# Patient Record
Sex: Female | Born: 1965 | Race: Black or African American | Hispanic: No | Marital: Married | Smoking: Never smoker
Health system: Southern US, Community
[De-identification: ages and names within clinical notes are randomized; demographics above are authoritative.]

## PROBLEM LIST (undated history)

## (undated) DIAGNOSIS — K219 Gastro-esophageal reflux disease without esophagitis: Secondary | ICD-10-CM

## (undated) DIAGNOSIS — D649 Anemia, unspecified: Secondary | ICD-10-CM

## (undated) DIAGNOSIS — N2581 Secondary hyperparathyroidism of renal origin: Secondary | ICD-10-CM

## (undated) DIAGNOSIS — N189 Chronic kidney disease, unspecified: Secondary | ICD-10-CM

## (undated) HISTORY — PX: TUBAL LIGATION: SHX77

---

## 1996-11-15 HISTORY — PX: OOPHORECTOMY: SHX86

## 2010-11-15 HISTORY — PX: AV FISTULA PLACEMENT: SHX1204

## 2013-02-14 ENCOUNTER — Other Ambulatory Visit (HOSPITAL_COMMUNITY): Payer: Self-pay | Admitting: Nephrology

## 2013-02-14 DIAGNOSIS — N186 End stage renal disease: Secondary | ICD-10-CM

## 2013-02-14 DIAGNOSIS — Q6119 Other polycystic kidney, infantile type: Secondary | ICD-10-CM

## 2013-02-19 ENCOUNTER — Encounter (HOSPITAL_COMMUNITY): Payer: Self-pay

## 2013-02-19 ENCOUNTER — Other Ambulatory Visit (HOSPITAL_COMMUNITY): Payer: Self-pay | Admitting: Nephrology

## 2013-02-19 ENCOUNTER — Ambulatory Visit (HOSPITAL_COMMUNITY)
Admission: RE | Admit: 2013-02-19 | Discharge: 2013-02-19 | Disposition: A | Discharge status: Home / Self Care | Payer: Managed Care, Other (non HMO) | Source: Ambulatory Visit | Attending: Nephrology | Admitting: Nephrology

## 2013-02-19 DIAGNOSIS — Y832 Surgical operation with anastomosis, bypass or graft as the cause of abnormal reaction of the patient, or of later complication, without mention of misadventure at the time of the procedure: Secondary | ICD-10-CM | POA: Insufficient documentation

## 2013-02-19 DIAGNOSIS — Q613 Polycystic kidney, unspecified: Secondary | ICD-10-CM | POA: Insufficient documentation

## 2013-02-19 DIAGNOSIS — Q6119 Other polycystic kidney, infantile type: Secondary | ICD-10-CM

## 2013-02-19 DIAGNOSIS — Z992 Dependence on renal dialysis: Secondary | ICD-10-CM | POA: Insufficient documentation

## 2013-02-19 DIAGNOSIS — D649 Anemia, unspecified: Secondary | ICD-10-CM | POA: Insufficient documentation

## 2013-02-19 DIAGNOSIS — N186 End stage renal disease: Secondary | ICD-10-CM | POA: Insufficient documentation

## 2013-02-19 DIAGNOSIS — N2581 Secondary hyperparathyroidism of renal origin: Secondary | ICD-10-CM | POA: Insufficient documentation

## 2013-02-19 DIAGNOSIS — I871 Compression of vein: Secondary | ICD-10-CM | POA: Insufficient documentation

## 2013-02-19 DIAGNOSIS — T82898A Other specified complication of vascular prosthetic devices, implants and grafts, initial encounter: Secondary | ICD-10-CM | POA: Insufficient documentation

## 2013-02-19 HISTORY — DX: Secondary hyperparathyroidism of renal origin: N25.81

## 2013-02-19 HISTORY — DX: Chronic kidney disease, unspecified: N18.9

## 2013-02-19 HISTORY — DX: Anemia, unspecified: D64.9

## 2013-02-19 MED ORDER — FENTANYL CITRATE 0.05 MG/ML IJ SOLN
INTRAMUSCULAR | Status: AC | PRN
  Administered 2013-02-19: 50 ug via INTRAVENOUS

## 2013-02-19 MED ORDER — MIDAZOLAM HCL 2 MG/2ML IJ SOLN
INTRAMUSCULAR | Status: AC
  Filled 2013-02-19: qty 2

## 2013-02-19 MED ORDER — IOHEXOL 300 MG/ML  SOLN
100.0000 mL | Freq: Once | INTRAMUSCULAR | Status: AC | PRN
  Administered 2013-02-19: 50 mL via INTRAVENOUS

## 2013-02-19 MED ORDER — FENTANYL CITRATE 0.05 MG/ML IJ SOLN
INTRAMUSCULAR | Status: AC
  Filled 2013-02-19: qty 2

## 2013-02-19 NOTE — Procedures (Signed)
Technically successful fistulogram with angioplasty.  No immediate complications.   

## 2013-02-19 NOTE — ED Notes (Signed)
O2 2l/[place] started 

## 2013-02-19 NOTE — H&P (Signed)
Lisa Oneal is an 47 y.o. female.   Chief Complaint: Dec'd flows at dialysis HPI: 47 y/o F with ESRD on dialysis, now with dec'd flows at dialysis.  Initial fistulagram demonstrates focal moderate narrowing within the main venous outflow amenable to percutaneous intervention.  Otherwise without complaint.  No fever, chills, CP or SOB  Past Medical History  Diagnosis Date  . Chronic kidney disease   . Anemia   . Secondary hyperparathyroidism     Past Surgical History  Procedure Laterality Date  . Av fistula placement Left 2012  . Tubal ligation    . Oophorectomy Left 1998    History reviewed. No pertinent family history. Social History:  reports that she has never smoked. She has never used smokeless tobacco. She reports that she does not use illicit drugs. Her alcohol history is not on file.  Allergies: No Known Allergies   (Not in a hospital admission)  No results found for this or any previous visit (from the past 48 hour(s)). No results found.  Review of Systems  Constitutional: Negative.   Respiratory: Negative.   Cardiovascular: Negative.   Psychiatric/Behavioral: Negative.     Blood pressure 145/75, pulse 92, resp. rate 19, last menstrual period 02/12/2013, SpO2 99.00%. Physical Exam  Constitutional: She appears well-developed and well-nourished.  Cardiovascular: Normal heart sounds.   Respiratory: Effort normal and breath sounds normal.  Psychiatric: She has a normal mood and affect. Her behavior is normal.     Assessment/Plan 47 y/o F with ESRD on dialysis, now with dec'd flows at dialysis.  Initial fistulagram demonstrates focal moderate narrowing within the main venous outflow amenable to percutaneous intervention.  Informed written consent obtained after a discussion of the benefits and risks (including but not limited to bleeding and vessel injury) for fistulagram with intervention.  Sandi Mariscal 02/19/2013, 2:13 PM

## 2013-02-19 NOTE — ED Notes (Signed)
Fentanyl given by Dr Pascal Lux through access.

## 2013-02-19 NOTE — ED Notes (Signed)
Tolerated procedure well.  Feels no sedation effects from fentanyl.  Ambulatory ready for D/C with family.  To dialysis tomorrow.  Encouraged to call or return for any problems with bleeding or pain.

## 2014-02-11 ENCOUNTER — Encounter (HOSPITAL_COMMUNITY): Payer: Self-pay

## 2014-02-14 ENCOUNTER — Other Ambulatory Visit: Payer: Self-pay

## 2014-02-18 ENCOUNTER — Other Ambulatory Visit: Payer: Self-pay | Admitting: *Deleted

## 2014-02-19 ENCOUNTER — Ambulatory Visit (HOSPITAL_COMMUNITY)
Admission: RE | Admit: 2014-02-19 | Discharge: 2014-02-19 | Disposition: A | Discharge status: Home / Self Care | Payer: Managed Care, Other (non HMO) | Source: Ambulatory Visit | Attending: Surgery | Admitting: Surgery

## 2014-02-19 ENCOUNTER — Encounter (HOSPITAL_COMMUNITY)
Admission: RE | Disposition: A | Discharge status: Home / Self Care | Payer: Self-pay | Source: Ambulatory Visit | Attending: Surgery

## 2014-02-19 DIAGNOSIS — T82898A Other specified complication of vascular prosthetic devices, implants and grafts, initial encounter: Secondary | ICD-10-CM

## 2014-02-19 DIAGNOSIS — N189 Chronic kidney disease, unspecified: Secondary | ICD-10-CM | POA: Insufficient documentation

## 2014-02-19 DIAGNOSIS — N2581 Secondary hyperparathyroidism of renal origin: Secondary | ICD-10-CM | POA: Insufficient documentation

## 2014-02-19 DIAGNOSIS — Y838 Other surgical procedures as the cause of abnormal reaction of the patient, or of later complication, without mention of misadventure at the time of the procedure: Secondary | ICD-10-CM | POA: Insufficient documentation

## 2014-02-19 HISTORY — PX: SHUNTOGRAM: SHX5491

## 2014-02-19 LAB — POCT I-STAT, CHEM 8
BUN: 34 mg/dL — ABNORMAL HIGH (ref 6–23)
Calcium, Ion: 1.07 mmol/L — ABNORMAL LOW (ref 1.12–1.23)
Chloride: 98 mEq/L (ref 96–112)
Creatinine, Ser: 12 mg/dL — ABNORMAL HIGH (ref 0.50–1.10)
Glucose, Bld: 97 mg/dL (ref 70–99)
HEMATOCRIT: 36 % (ref 36.0–46.0)
HEMOGLOBIN: 12.2 g/dL (ref 12.0–15.0)
POTASSIUM: 4.6 meq/L (ref 3.7–5.3)
SODIUM: 137 meq/L (ref 137–147)
TCO2: 28 mmol/L (ref 0–100)

## 2014-02-19 SURGERY — ASSESSMENT, SHUNT FUNCTION, WITH CONTRAST RADIOGRAPHIC STUDY
Anesthesia: LOCAL

## 2014-02-19 MED ORDER — ACETAMINOPHEN 325 MG PO TABS
325.0000 mg | ORAL_TABLET | ORAL | Status: DC | PRN
Start: 2014-02-19 — End: 2014-02-19
  Filled 2014-02-19: qty 2

## 2014-02-19 MED ORDER — SODIUM CHLORIDE 0.9 % IJ SOLN: 3.0000 mL | INTRAMUSCULAR | Status: DC | PRN

## 2014-02-19 MED ORDER — HEPARIN (PORCINE) IN NACL 2-0.9 UNIT/ML-% IJ SOLN
INTRAMUSCULAR | Status: AC
  Filled 2014-02-19: qty 500

## 2014-02-19 MED ORDER — LIDOCAINE HCL (PF) 1 % IJ SOLN
INTRAMUSCULAR | Status: AC
  Filled 2014-02-19: qty 30

## 2014-02-19 MED ORDER — ONDANSETRON HCL 4 MG/2ML IJ SOLN: 4.0000 mg | Freq: Four times a day (QID) | INTRAMUSCULAR | Status: DC | PRN

## 2014-02-19 MED ORDER — ACETAMINOPHEN 325 MG RE SUPP
325.0000 mg | RECTAL | Status: DC | PRN
  Filled 2014-02-19: qty 2

## 2014-02-19 NOTE — H&P (Signed)
      Patient name: Lisa Oneal MRN: [id number] DOB: [birth year] Sex: female    No chief complaint on file.   HISTORY OF PRESENT ILLNESS: The patient is here for evaluation of her left upper arm AV fistula which was placed in 2012 at [facility].  She has been having decreased flow rates.  Past Medical History  Diagnosis Date  . Chronic kidney disease   . Anemia   . Secondary hyperparathyroidism     Past Surgical History  Procedure Laterality Date  . Av fistula placement Left 2012  . Tubal ligation    . Oophorectomy Left 1998    History   Social History  . Marital Status: Married    Spouse Name: N/A    Number of Children: N/A  . Years of Education: N/A   Occupational History  . Not on file.   Social History Main Topics  . Smoking status: Never Smoker   . Smokeless tobacco: Never Used  . Alcohol Use: Not on file  . Drug Use: No  . Sexual Activity: Not on file   Other Topics Concern  . Not on file   Social History Narrative  . No narrative on file    No family history on file.  Allergies as of 02/08/2014  . (No Known Allergies)    No current facility-administered medications on file prior to encounter.   Current Outpatient Prescriptions on File Prior to Encounter  Medication Sig Dispense Refill  . sevelamer carbonate (RENVELA) 800 MG tablet Take 3,200 mg by mouth 3 (three) times daily with meals. Take 4 tablets 3 times a day with meals & 2 tablets with snacks      . zolpidem (AMBIEN) 10 MG tablet Take 10 mg by mouth at bedtime as needed for sleep.          REVIEW OF SYSTEMS: Cardiovascular: No chest pain, chest pressure, palpitations, orthopnea, or dyspnea on exertion. No claudication or rest pain,  No history of DVT or phlebitis. Pulmonary: No productive cough, asthma or wheezing. Neurologic: No weakness, paresthesias, aphasia, or amaurosis. No dizziness. Hematologic: No bleeding problems or clotting disorders. Musculoskeletal: No joint pain  or joint swelling. Gastrointestinal: No blood in stool or hematemesis Genitourinary: No dysuria or hematuria. Psychiatric:: No history of major depression. Integumentary: No rashes or ulcers. Constitutional: No fever or chills.  PHYSICAL EXAMINATION:   Vital signs are BP 124/76  Pulse 98  Temp(Src) 98.3 F (36.8 C) (Oral)  Resp 18  Ht 5\' 3"  (1.6 m)  Wt 190 lb (86.183 kg)  BMI 33.67 kg/m2  SpO2 98% General: The patient appears their stated age. HEENT:  No gross abnormalities Pulmonary:  Non labored breathing Musculoskeletal: There are no major deformities. Neurologic: No focal weakness or paresthesias are detected, Skin: There are no ulcer or rashes noted. Psychiatric: The patient has normal affect. Cardiovascular: Palpable thrill within the left upper arm fistula which become diminished in the upper arm  Diagnostic Studies None  Assessment: End stage renal disease Plan: Left upper arm fistulogram with possible intervention.  The details of the procedure were discussed with the patient.  Eldridge Abrahams, M.D. [facility] [place] Office: [phone] Pager:  [phone]

## 2014-02-19 NOTE — Discharge Instructions (Signed)
AV Fistula °Care After °Refer to this sheet in the next few weeks. These instructions provide you with information on caring for yourself after your procedure. Your caregiver may also give you more specific instructions. Your treatment has been planned according to current medical practices, but problems sometimes occur. Call your caregiver if you have any problems or questions after your procedure. °HOME CARE INSTRUCTIONS  °· Do not drive a car or take public transportation alone. °· Do not drink alcohol. °· Only take medicine that has been prescribed by your caregiver. °· Do not sign important papers or make important decisions. °· Have a responsible person with you. °· Ask your caregiver to show you how to check your access at home for a vibration (called a "thrill") or for a sound (called a "bruit" pronounced brew-ee). °· Your vein will need time to enlarge and mature so needles can be inserted for dialysis. Follow your caregiver's instructions about what you need to do to make this happen. °· Keep dressings clean and dry. °· Keep the arm elevated above your heart. Use a pillow. °· Rest. °· Use the arm as usual for all activities. °· Have the stitches or tape closures removed in 10 to 14 days, or as directed by your caregiver. °· Do not sleep or lie on the area of the fistula or that arm. This may decrease or stop the blood flow through your fistula. °· Do not allow blood pressures to be taken on this arm. °· Do not allow blood drawing to be done from the graft. °· Do not wear tight clothing around the access site or on the arm. °· Avoid lifting heavy objects with the arm that has the fistula. °· Do not use creams or lotions over the access site. °SEEK MEDICAL CARE IF:  °· You have a fever. °· You have swelling around the fistula that gets worse, or you have new pain. °· You have unusual bleeding at the fistula site or from any other area. °· You have pus or other drainage at the fistula site. °· You have skin  redness or red streaking on the skin around, above, or below the fistula site. °· Your access site feels warm. °· You have any flu-like symptoms. °SEEK IMMEDIATE MEDICAL CARE IF:  °· You have pain, numbness, or an unusual pale skin on the hand or on the side of your fistula. °· You have dizziness or weakness that you have not had before. °· You have shortness of breath. °· You have chest pain. °· Your fistula disconnects or breaks, and there is bleeding that cannot be easily controlled. °Call for local emergency medical help. Do not try to drive yourself to the hospital. °MAKE SURE YOU °· Understand these instructions. °· Will watch your condition. °· Will get help right away if you are not doing well or get worse. °Document Released: 11/01/2005 Document Revised: 01/24/2012 Document Reviewed: 04/21/2011 °ExitCare® Patient Information ©2014 [employer]. ° °

## 2014-02-19 NOTE — Op Note (Signed)
    Patient name: Lisa Oneal MRN: [id number] DOB: 1966-07-30 Sex: female  02/19/2014 Pre-operative Diagnosis: End stage renal disease Post-operative diagnosis:  Same Surgeon:  Eldridge Abrahams Procedure Performed:  1.  ultrasound-guided access, left upper arm fistula  2.  fistulogram  3.  angioplasty, left cephalic vein  4.  followup x1   Indications:  The patient has had a left upper arm fistula placed at an outside institution.  She has had decreased flow rates and was sent for further evaluation  Procedure:  The patient was identified in the holding area and taken to room 8.  The patient was then placed supine on the table and prepped and draped in the usual sterile fashion.  A time out was called.  Ultrasound was used to evaluate the fistula.  The vein was patent and compressible.  A digital ultrasound image was acquired.  The fistula was then accessed under ultrasound guidance using a micropuncture needle.  An 018 wire was then asvanced without resistance and a micropuncture sheath was placed.  Contrast injections were then performed through the sheath.  Findings:  The arterial venous anastomosis is widely patent.  The cephalic vein fistula is patent throughout it's course however at the humeral head there is a significant narrowing approximately 80%.  The remainder of the central venous system is widely patent.   Intervention:  After the above images were acquired, the decision was made to proceed with intervention.  Over a 035 wire, a 6 French sheath was placed.  I then selected a 5 x 40 Mustang balloon.  This was used to perform balloon angioplasty of the cephalic vein stenosis.  It was taken to burst pressure.  Followup imaging revealed improved, but suboptimal result.  I therefore selected a 7 x 40 balloon.  At approximately 18 atmospheres, it reached profile.  Followup imaging revealed a small tear to the vein.  I reinserted the balloon, to get to profile at about 8 atmospheres  and held for 2 minutes.  A followup imaging revealed persistence of the tear however it was not flow limiting and there was no extravasation.  I elected to leave this.  The sheath was removed over a suture.  There were no complications  Impression:  #1  successful angioplasty of a [id number] left cephalic vein stenosis at the level of the humeral head using a 7 x 40 balloon  #2  the patient's the fistula remained amenable to percutaneous intervention   V. Annamarie Major, M.D. [facility] [place] Office: [phone] Pager:  [phone]

## 2014-02-20 ENCOUNTER — Ambulatory Visit: Admit: 2014-02-20 | Payer: Self-pay | Admitting: Surgery

## 2014-02-20 SURGERY — THROMBECTOMY AND REVISION OF ARTERIOVENTOUS (AV) GORETEX  GRAFT
Anesthesia: Monitor Anesthesia Care | Laterality: Right

## 2014-10-24 ENCOUNTER — Encounter (HOSPITAL_COMMUNITY): Payer: Self-pay | Admitting: Surgery

## 2015-11-17 DIAGNOSIS — Z992 Dependence on renal dialysis: Secondary | ICD-10-CM | POA: Diagnosis not present

## 2015-11-17 DIAGNOSIS — N2581 Secondary hyperparathyroidism of renal origin: Secondary | ICD-10-CM | POA: Diagnosis not present

## 2015-11-17 DIAGNOSIS — E611 Iron deficiency: Secondary | ICD-10-CM | POA: Diagnosis not present

## 2015-11-17 DIAGNOSIS — N186 End stage renal disease: Secondary | ICD-10-CM | POA: Diagnosis not present

## 2015-11-17 DIAGNOSIS — D631 Anemia in chronic kidney disease: Secondary | ICD-10-CM | POA: Diagnosis not present

## 2015-11-19 DIAGNOSIS — E611 Iron deficiency: Secondary | ICD-10-CM | POA: Diagnosis not present

## 2015-11-19 DIAGNOSIS — D631 Anemia in chronic kidney disease: Secondary | ICD-10-CM | POA: Diagnosis not present

## 2015-11-19 DIAGNOSIS — N2581 Secondary hyperparathyroidism of renal origin: Secondary | ICD-10-CM | POA: Diagnosis not present

## 2015-11-19 DIAGNOSIS — Z992 Dependence on renal dialysis: Secondary | ICD-10-CM | POA: Diagnosis not present

## 2015-11-19 DIAGNOSIS — N186 End stage renal disease: Secondary | ICD-10-CM | POA: Diagnosis not present

## 2015-11-21 DIAGNOSIS — D631 Anemia in chronic kidney disease: Secondary | ICD-10-CM | POA: Diagnosis not present

## 2015-11-21 DIAGNOSIS — Z992 Dependence on renal dialysis: Secondary | ICD-10-CM | POA: Diagnosis not present

## 2015-11-21 DIAGNOSIS — N186 End stage renal disease: Secondary | ICD-10-CM | POA: Diagnosis not present

## 2015-11-21 DIAGNOSIS — N2581 Secondary hyperparathyroidism of renal origin: Secondary | ICD-10-CM | POA: Diagnosis not present

## 2015-11-21 DIAGNOSIS — E611 Iron deficiency: Secondary | ICD-10-CM | POA: Diagnosis not present

## 2015-11-24 DIAGNOSIS — Z992 Dependence on renal dialysis: Secondary | ICD-10-CM | POA: Diagnosis not present

## 2015-11-24 DIAGNOSIS — E611 Iron deficiency: Secondary | ICD-10-CM | POA: Diagnosis not present

## 2015-11-24 DIAGNOSIS — N2581 Secondary hyperparathyroidism of renal origin: Secondary | ICD-10-CM | POA: Diagnosis not present

## 2015-11-24 DIAGNOSIS — D631 Anemia in chronic kidney disease: Secondary | ICD-10-CM | POA: Diagnosis not present

## 2015-11-24 DIAGNOSIS — N186 End stage renal disease: Secondary | ICD-10-CM | POA: Diagnosis not present

## 2015-11-26 DIAGNOSIS — E611 Iron deficiency: Secondary | ICD-10-CM | POA: Diagnosis not present

## 2015-11-26 DIAGNOSIS — Z992 Dependence on renal dialysis: Secondary | ICD-10-CM | POA: Diagnosis not present

## 2015-11-26 DIAGNOSIS — N186 End stage renal disease: Secondary | ICD-10-CM | POA: Diagnosis not present

## 2015-11-26 DIAGNOSIS — D631 Anemia in chronic kidney disease: Secondary | ICD-10-CM | POA: Diagnosis not present

## 2015-11-26 DIAGNOSIS — N2581 Secondary hyperparathyroidism of renal origin: Secondary | ICD-10-CM | POA: Diagnosis not present

## 2015-11-28 DIAGNOSIS — D631 Anemia in chronic kidney disease: Secondary | ICD-10-CM | POA: Diagnosis not present

## 2015-11-28 DIAGNOSIS — N2581 Secondary hyperparathyroidism of renal origin: Secondary | ICD-10-CM | POA: Diagnosis not present

## 2015-11-28 DIAGNOSIS — E611 Iron deficiency: Secondary | ICD-10-CM | POA: Diagnosis not present

## 2015-11-28 DIAGNOSIS — Z992 Dependence on renal dialysis: Secondary | ICD-10-CM | POA: Diagnosis not present

## 2015-11-28 DIAGNOSIS — N186 End stage renal disease: Secondary | ICD-10-CM | POA: Diagnosis not present

## 2015-12-01 DIAGNOSIS — D631 Anemia in chronic kidney disease: Secondary | ICD-10-CM | POA: Diagnosis not present

## 2015-12-01 DIAGNOSIS — N2581 Secondary hyperparathyroidism of renal origin: Secondary | ICD-10-CM | POA: Diagnosis not present

## 2015-12-01 DIAGNOSIS — Z992 Dependence on renal dialysis: Secondary | ICD-10-CM | POA: Diagnosis not present

## 2015-12-01 DIAGNOSIS — N186 End stage renal disease: Secondary | ICD-10-CM | POA: Diagnosis not present

## 2015-12-01 DIAGNOSIS — E611 Iron deficiency: Secondary | ICD-10-CM | POA: Diagnosis not present

## 2015-12-02 DIAGNOSIS — Z992 Dependence on renal dialysis: Secondary | ICD-10-CM | POA: Diagnosis not present

## 2015-12-02 DIAGNOSIS — M7730 Calcaneal spur, unspecified foot: Secondary | ICD-10-CM | POA: Diagnosis not present

## 2015-12-02 DIAGNOSIS — M79673 Pain in unspecified foot: Secondary | ICD-10-CM | POA: Diagnosis not present

## 2015-12-02 DIAGNOSIS — M7661 Achilles tendinitis, right leg: Secondary | ICD-10-CM | POA: Diagnosis not present

## 2015-12-02 DIAGNOSIS — M2010 Hallux valgus (acquired), unspecified foot: Secondary | ICD-10-CM | POA: Diagnosis not present

## 2015-12-03 DIAGNOSIS — Z992 Dependence on renal dialysis: Secondary | ICD-10-CM | POA: Diagnosis not present

## 2015-12-03 DIAGNOSIS — N186 End stage renal disease: Secondary | ICD-10-CM | POA: Diagnosis not present

## 2015-12-03 DIAGNOSIS — E611 Iron deficiency: Secondary | ICD-10-CM | POA: Diagnosis not present

## 2015-12-03 DIAGNOSIS — D631 Anemia in chronic kidney disease: Secondary | ICD-10-CM | POA: Diagnosis not present

## 2015-12-03 DIAGNOSIS — N2581 Secondary hyperparathyroidism of renal origin: Secondary | ICD-10-CM | POA: Diagnosis not present

## 2015-12-05 DIAGNOSIS — N186 End stage renal disease: Secondary | ICD-10-CM | POA: Diagnosis not present

## 2015-12-05 DIAGNOSIS — E611 Iron deficiency: Secondary | ICD-10-CM | POA: Diagnosis not present

## 2015-12-05 DIAGNOSIS — Z992 Dependence on renal dialysis: Secondary | ICD-10-CM | POA: Diagnosis not present

## 2015-12-05 DIAGNOSIS — D631 Anemia in chronic kidney disease: Secondary | ICD-10-CM | POA: Diagnosis not present

## 2015-12-05 DIAGNOSIS — N2581 Secondary hyperparathyroidism of renal origin: Secondary | ICD-10-CM | POA: Diagnosis not present

## 2015-12-08 DIAGNOSIS — E611 Iron deficiency: Secondary | ICD-10-CM | POA: Diagnosis not present

## 2015-12-08 DIAGNOSIS — N2581 Secondary hyperparathyroidism of renal origin: Secondary | ICD-10-CM | POA: Diagnosis not present

## 2015-12-08 DIAGNOSIS — D631 Anemia in chronic kidney disease: Secondary | ICD-10-CM | POA: Diagnosis not present

## 2015-12-08 DIAGNOSIS — N186 End stage renal disease: Secondary | ICD-10-CM | POA: Diagnosis not present

## 2015-12-08 DIAGNOSIS — Z992 Dependence on renal dialysis: Secondary | ICD-10-CM | POA: Diagnosis not present

## 2015-12-10 DIAGNOSIS — D631 Anemia in chronic kidney disease: Secondary | ICD-10-CM | POA: Diagnosis not present

## 2015-12-10 DIAGNOSIS — N2581 Secondary hyperparathyroidism of renal origin: Secondary | ICD-10-CM | POA: Diagnosis not present

## 2015-12-10 DIAGNOSIS — Z992 Dependence on renal dialysis: Secondary | ICD-10-CM | POA: Diagnosis not present

## 2015-12-10 DIAGNOSIS — N186 End stage renal disease: Secondary | ICD-10-CM | POA: Diagnosis not present

## 2015-12-10 DIAGNOSIS — E611 Iron deficiency: Secondary | ICD-10-CM | POA: Diagnosis not present

## 2015-12-12 DIAGNOSIS — E611 Iron deficiency: Secondary | ICD-10-CM | POA: Diagnosis not present

## 2015-12-12 DIAGNOSIS — N2581 Secondary hyperparathyroidism of renal origin: Secondary | ICD-10-CM | POA: Diagnosis not present

## 2015-12-12 DIAGNOSIS — Z992 Dependence on renal dialysis: Secondary | ICD-10-CM | POA: Diagnosis not present

## 2015-12-12 DIAGNOSIS — N186 End stage renal disease: Secondary | ICD-10-CM | POA: Diagnosis not present

## 2015-12-12 DIAGNOSIS — D631 Anemia in chronic kidney disease: Secondary | ICD-10-CM | POA: Diagnosis not present

## 2015-12-15 DIAGNOSIS — Z992 Dependence on renal dialysis: Secondary | ICD-10-CM | POA: Diagnosis not present

## 2015-12-15 DIAGNOSIS — N186 End stage renal disease: Secondary | ICD-10-CM | POA: Diagnosis not present

## 2015-12-15 DIAGNOSIS — E611 Iron deficiency: Secondary | ICD-10-CM | POA: Diagnosis not present

## 2015-12-15 DIAGNOSIS — N2581 Secondary hyperparathyroidism of renal origin: Secondary | ICD-10-CM | POA: Diagnosis not present

## 2015-12-15 DIAGNOSIS — D631 Anemia in chronic kidney disease: Secondary | ICD-10-CM | POA: Diagnosis not present

## 2015-12-16 DIAGNOSIS — Z992 Dependence on renal dialysis: Secondary | ICD-10-CM | POA: Diagnosis not present

## 2015-12-16 DIAGNOSIS — N186 End stage renal disease: Secondary | ICD-10-CM | POA: Diagnosis not present

## 2015-12-17 DIAGNOSIS — E611 Iron deficiency: Secondary | ICD-10-CM | POA: Diagnosis not present

## 2015-12-17 DIAGNOSIS — N2581 Secondary hyperparathyroidism of renal origin: Secondary | ICD-10-CM | POA: Diagnosis not present

## 2015-12-17 DIAGNOSIS — N186 End stage renal disease: Secondary | ICD-10-CM | POA: Diagnosis not present

## 2015-12-17 DIAGNOSIS — Z992 Dependence on renal dialysis: Secondary | ICD-10-CM | POA: Diagnosis not present

## 2015-12-17 DIAGNOSIS — D631 Anemia in chronic kidney disease: Secondary | ICD-10-CM | POA: Diagnosis not present

## 2015-12-19 DIAGNOSIS — N186 End stage renal disease: Secondary | ICD-10-CM | POA: Diagnosis not present

## 2015-12-19 DIAGNOSIS — N2581 Secondary hyperparathyroidism of renal origin: Secondary | ICD-10-CM | POA: Diagnosis not present

## 2015-12-19 DIAGNOSIS — Z992 Dependence on renal dialysis: Secondary | ICD-10-CM | POA: Diagnosis not present

## 2015-12-19 DIAGNOSIS — D631 Anemia in chronic kidney disease: Secondary | ICD-10-CM | POA: Diagnosis not present

## 2015-12-19 DIAGNOSIS — E611 Iron deficiency: Secondary | ICD-10-CM | POA: Diagnosis not present

## 2015-12-22 DIAGNOSIS — Z992 Dependence on renal dialysis: Secondary | ICD-10-CM | POA: Diagnosis not present

## 2015-12-22 DIAGNOSIS — N2581 Secondary hyperparathyroidism of renal origin: Secondary | ICD-10-CM | POA: Diagnosis not present

## 2015-12-22 DIAGNOSIS — E611 Iron deficiency: Secondary | ICD-10-CM | POA: Diagnosis not present

## 2015-12-22 DIAGNOSIS — N186 End stage renal disease: Secondary | ICD-10-CM | POA: Diagnosis not present

## 2015-12-22 DIAGNOSIS — D631 Anemia in chronic kidney disease: Secondary | ICD-10-CM | POA: Diagnosis not present

## 2015-12-24 DIAGNOSIS — D631 Anemia in chronic kidney disease: Secondary | ICD-10-CM | POA: Diagnosis not present

## 2015-12-24 DIAGNOSIS — Z992 Dependence on renal dialysis: Secondary | ICD-10-CM | POA: Diagnosis not present

## 2015-12-24 DIAGNOSIS — N186 End stage renal disease: Secondary | ICD-10-CM | POA: Diagnosis not present

## 2015-12-24 DIAGNOSIS — E611 Iron deficiency: Secondary | ICD-10-CM | POA: Diagnosis not present

## 2015-12-24 DIAGNOSIS — N2581 Secondary hyperparathyroidism of renal origin: Secondary | ICD-10-CM | POA: Diagnosis not present

## 2015-12-26 DIAGNOSIS — D631 Anemia in chronic kidney disease: Secondary | ICD-10-CM | POA: Diagnosis not present

## 2015-12-26 DIAGNOSIS — Z992 Dependence on renal dialysis: Secondary | ICD-10-CM | POA: Diagnosis not present

## 2015-12-26 DIAGNOSIS — N186 End stage renal disease: Secondary | ICD-10-CM | POA: Diagnosis not present

## 2015-12-26 DIAGNOSIS — E611 Iron deficiency: Secondary | ICD-10-CM | POA: Diagnosis not present

## 2015-12-26 DIAGNOSIS — N2581 Secondary hyperparathyroidism of renal origin: Secondary | ICD-10-CM | POA: Diagnosis not present

## 2015-12-29 DIAGNOSIS — N2581 Secondary hyperparathyroidism of renal origin: Secondary | ICD-10-CM | POA: Diagnosis not present

## 2015-12-29 DIAGNOSIS — Z992 Dependence on renal dialysis: Secondary | ICD-10-CM | POA: Diagnosis not present

## 2015-12-29 DIAGNOSIS — N186 End stage renal disease: Secondary | ICD-10-CM | POA: Diagnosis not present

## 2015-12-29 DIAGNOSIS — D631 Anemia in chronic kidney disease: Secondary | ICD-10-CM | POA: Diagnosis not present

## 2015-12-29 DIAGNOSIS — E611 Iron deficiency: Secondary | ICD-10-CM | POA: Diagnosis not present

## 2015-12-31 DIAGNOSIS — E611 Iron deficiency: Secondary | ICD-10-CM | POA: Diagnosis not present

## 2015-12-31 DIAGNOSIS — N2581 Secondary hyperparathyroidism of renal origin: Secondary | ICD-10-CM | POA: Diagnosis not present

## 2015-12-31 DIAGNOSIS — D631 Anemia in chronic kidney disease: Secondary | ICD-10-CM | POA: Diagnosis not present

## 2015-12-31 DIAGNOSIS — N186 End stage renal disease: Secondary | ICD-10-CM | POA: Diagnosis not present

## 2015-12-31 DIAGNOSIS — Z992 Dependence on renal dialysis: Secondary | ICD-10-CM | POA: Diagnosis not present

## 2016-01-02 DIAGNOSIS — E611 Iron deficiency: Secondary | ICD-10-CM | POA: Diagnosis not present

## 2016-01-02 DIAGNOSIS — Z992 Dependence on renal dialysis: Secondary | ICD-10-CM | POA: Diagnosis not present

## 2016-01-02 DIAGNOSIS — N186 End stage renal disease: Secondary | ICD-10-CM | POA: Diagnosis not present

## 2016-01-02 DIAGNOSIS — N2581 Secondary hyperparathyroidism of renal origin: Secondary | ICD-10-CM | POA: Diagnosis not present

## 2016-01-02 DIAGNOSIS — D631 Anemia in chronic kidney disease: Secondary | ICD-10-CM | POA: Diagnosis not present

## 2016-01-05 DIAGNOSIS — E611 Iron deficiency: Secondary | ICD-10-CM | POA: Diagnosis not present

## 2016-01-05 DIAGNOSIS — N186 End stage renal disease: Secondary | ICD-10-CM | POA: Diagnosis not present

## 2016-01-05 DIAGNOSIS — Z992 Dependence on renal dialysis: Secondary | ICD-10-CM | POA: Diagnosis not present

## 2016-01-05 DIAGNOSIS — D631 Anemia in chronic kidney disease: Secondary | ICD-10-CM | POA: Diagnosis not present

## 2016-01-05 DIAGNOSIS — N2581 Secondary hyperparathyroidism of renal origin: Secondary | ICD-10-CM | POA: Diagnosis not present

## 2016-01-07 DIAGNOSIS — D631 Anemia in chronic kidney disease: Secondary | ICD-10-CM | POA: Diagnosis not present

## 2016-01-07 DIAGNOSIS — E611 Iron deficiency: Secondary | ICD-10-CM | POA: Diagnosis not present

## 2016-01-07 DIAGNOSIS — N2581 Secondary hyperparathyroidism of renal origin: Secondary | ICD-10-CM | POA: Diagnosis not present

## 2016-01-07 DIAGNOSIS — Z992 Dependence on renal dialysis: Secondary | ICD-10-CM | POA: Diagnosis not present

## 2016-01-07 DIAGNOSIS — N186 End stage renal disease: Secondary | ICD-10-CM | POA: Diagnosis not present

## 2016-01-09 DIAGNOSIS — D631 Anemia in chronic kidney disease: Secondary | ICD-10-CM | POA: Diagnosis not present

## 2016-01-09 DIAGNOSIS — Z992 Dependence on renal dialysis: Secondary | ICD-10-CM | POA: Diagnosis not present

## 2016-01-09 DIAGNOSIS — N186 End stage renal disease: Secondary | ICD-10-CM | POA: Diagnosis not present

## 2016-01-09 DIAGNOSIS — E611 Iron deficiency: Secondary | ICD-10-CM | POA: Diagnosis not present

## 2016-01-09 DIAGNOSIS — N2581 Secondary hyperparathyroidism of renal origin: Secondary | ICD-10-CM | POA: Diagnosis not present

## 2016-01-12 DIAGNOSIS — Z992 Dependence on renal dialysis: Secondary | ICD-10-CM | POA: Diagnosis not present

## 2016-01-12 DIAGNOSIS — E611 Iron deficiency: Secondary | ICD-10-CM | POA: Diagnosis not present

## 2016-01-12 DIAGNOSIS — N186 End stage renal disease: Secondary | ICD-10-CM | POA: Diagnosis not present

## 2016-01-12 DIAGNOSIS — D631 Anemia in chronic kidney disease: Secondary | ICD-10-CM | POA: Diagnosis not present

## 2016-01-12 DIAGNOSIS — N2581 Secondary hyperparathyroidism of renal origin: Secondary | ICD-10-CM | POA: Diagnosis not present

## 2016-01-13 DIAGNOSIS — N186 End stage renal disease: Secondary | ICD-10-CM | POA: Diagnosis not present

## 2016-01-13 DIAGNOSIS — Z992 Dependence on renal dialysis: Secondary | ICD-10-CM | POA: Diagnosis not present

## 2016-01-14 DIAGNOSIS — D631 Anemia in chronic kidney disease: Secondary | ICD-10-CM | POA: Diagnosis not present

## 2016-01-14 DIAGNOSIS — N186 End stage renal disease: Secondary | ICD-10-CM | POA: Diagnosis not present

## 2016-01-14 DIAGNOSIS — D509 Iron deficiency anemia, unspecified: Secondary | ICD-10-CM | POA: Diagnosis not present

## 2016-01-14 DIAGNOSIS — N2581 Secondary hyperparathyroidism of renal origin: Secondary | ICD-10-CM | POA: Diagnosis not present

## 2016-01-14 DIAGNOSIS — Z992 Dependence on renal dialysis: Secondary | ICD-10-CM | POA: Diagnosis not present

## 2016-01-14 DIAGNOSIS — E611 Iron deficiency: Secondary | ICD-10-CM | POA: Diagnosis not present

## 2016-01-16 DIAGNOSIS — D631 Anemia in chronic kidney disease: Secondary | ICD-10-CM | POA: Diagnosis not present

## 2016-01-16 DIAGNOSIS — Z992 Dependence on renal dialysis: Secondary | ICD-10-CM | POA: Diagnosis not present

## 2016-01-16 DIAGNOSIS — D509 Iron deficiency anemia, unspecified: Secondary | ICD-10-CM | POA: Diagnosis not present

## 2016-01-16 DIAGNOSIS — N2581 Secondary hyperparathyroidism of renal origin: Secondary | ICD-10-CM | POA: Diagnosis not present

## 2016-01-16 DIAGNOSIS — N186 End stage renal disease: Secondary | ICD-10-CM | POA: Diagnosis not present

## 2016-01-16 DIAGNOSIS — E611 Iron deficiency: Secondary | ICD-10-CM | POA: Diagnosis not present

## 2016-01-19 DIAGNOSIS — N186 End stage renal disease: Secondary | ICD-10-CM | POA: Diagnosis not present

## 2016-01-19 DIAGNOSIS — N2581 Secondary hyperparathyroidism of renal origin: Secondary | ICD-10-CM | POA: Diagnosis not present

## 2016-01-19 DIAGNOSIS — E611 Iron deficiency: Secondary | ICD-10-CM | POA: Diagnosis not present

## 2016-01-19 DIAGNOSIS — Z992 Dependence on renal dialysis: Secondary | ICD-10-CM | POA: Diagnosis not present

## 2016-01-19 DIAGNOSIS — D631 Anemia in chronic kidney disease: Secondary | ICD-10-CM | POA: Diagnosis not present

## 2016-01-19 DIAGNOSIS — D509 Iron deficiency anemia, unspecified: Secondary | ICD-10-CM | POA: Diagnosis not present

## 2016-01-21 DIAGNOSIS — D509 Iron deficiency anemia, unspecified: Secondary | ICD-10-CM | POA: Diagnosis not present

## 2016-01-21 DIAGNOSIS — N2581 Secondary hyperparathyroidism of renal origin: Secondary | ICD-10-CM | POA: Diagnosis not present

## 2016-01-21 DIAGNOSIS — N186 End stage renal disease: Secondary | ICD-10-CM | POA: Diagnosis not present

## 2016-01-21 DIAGNOSIS — E611 Iron deficiency: Secondary | ICD-10-CM | POA: Diagnosis not present

## 2016-01-21 DIAGNOSIS — D631 Anemia in chronic kidney disease: Secondary | ICD-10-CM | POA: Diagnosis not present

## 2016-01-21 DIAGNOSIS — Z992 Dependence on renal dialysis: Secondary | ICD-10-CM | POA: Diagnosis not present

## 2016-01-23 DIAGNOSIS — N2581 Secondary hyperparathyroidism of renal origin: Secondary | ICD-10-CM | POA: Diagnosis not present

## 2016-01-23 DIAGNOSIS — D631 Anemia in chronic kidney disease: Secondary | ICD-10-CM | POA: Diagnosis not present

## 2016-01-23 DIAGNOSIS — N186 End stage renal disease: Secondary | ICD-10-CM | POA: Diagnosis not present

## 2016-01-23 DIAGNOSIS — Z992 Dependence on renal dialysis: Secondary | ICD-10-CM | POA: Diagnosis not present

## 2016-01-23 DIAGNOSIS — D509 Iron deficiency anemia, unspecified: Secondary | ICD-10-CM | POA: Diagnosis not present

## 2016-01-23 DIAGNOSIS — E611 Iron deficiency: Secondary | ICD-10-CM | POA: Diagnosis not present

## 2016-01-26 DIAGNOSIS — N2581 Secondary hyperparathyroidism of renal origin: Secondary | ICD-10-CM | POA: Diagnosis not present

## 2016-01-26 DIAGNOSIS — Z992 Dependence on renal dialysis: Secondary | ICD-10-CM | POA: Diagnosis not present

## 2016-01-26 DIAGNOSIS — E611 Iron deficiency: Secondary | ICD-10-CM | POA: Diagnosis not present

## 2016-01-26 DIAGNOSIS — D631 Anemia in chronic kidney disease: Secondary | ICD-10-CM | POA: Diagnosis not present

## 2016-01-26 DIAGNOSIS — D509 Iron deficiency anemia, unspecified: Secondary | ICD-10-CM | POA: Diagnosis not present

## 2016-01-26 DIAGNOSIS — N186 End stage renal disease: Secondary | ICD-10-CM | POA: Diagnosis not present

## 2016-01-28 DIAGNOSIS — D631 Anemia in chronic kidney disease: Secondary | ICD-10-CM | POA: Diagnosis not present

## 2016-01-28 DIAGNOSIS — D509 Iron deficiency anemia, unspecified: Secondary | ICD-10-CM | POA: Diagnosis not present

## 2016-01-28 DIAGNOSIS — N2581 Secondary hyperparathyroidism of renal origin: Secondary | ICD-10-CM | POA: Diagnosis not present

## 2016-01-28 DIAGNOSIS — E611 Iron deficiency: Secondary | ICD-10-CM | POA: Diagnosis not present

## 2016-01-28 DIAGNOSIS — N186 End stage renal disease: Secondary | ICD-10-CM | POA: Diagnosis not present

## 2016-01-28 DIAGNOSIS — Z992 Dependence on renal dialysis: Secondary | ICD-10-CM | POA: Diagnosis not present

## 2016-01-30 DIAGNOSIS — N186 End stage renal disease: Secondary | ICD-10-CM | POA: Diagnosis not present

## 2016-01-30 DIAGNOSIS — D509 Iron deficiency anemia, unspecified: Secondary | ICD-10-CM | POA: Diagnosis not present

## 2016-01-30 DIAGNOSIS — D631 Anemia in chronic kidney disease: Secondary | ICD-10-CM | POA: Diagnosis not present

## 2016-01-30 DIAGNOSIS — Z992 Dependence on renal dialysis: Secondary | ICD-10-CM | POA: Diagnosis not present

## 2016-01-30 DIAGNOSIS — E611 Iron deficiency: Secondary | ICD-10-CM | POA: Diagnosis not present

## 2016-01-30 DIAGNOSIS — N2581 Secondary hyperparathyroidism of renal origin: Secondary | ICD-10-CM | POA: Diagnosis not present

## 2016-02-02 DIAGNOSIS — N186 End stage renal disease: Secondary | ICD-10-CM | POA: Diagnosis not present

## 2016-02-02 DIAGNOSIS — E611 Iron deficiency: Secondary | ICD-10-CM | POA: Diagnosis not present

## 2016-02-02 DIAGNOSIS — D631 Anemia in chronic kidney disease: Secondary | ICD-10-CM | POA: Diagnosis not present

## 2016-02-02 DIAGNOSIS — Z992 Dependence on renal dialysis: Secondary | ICD-10-CM | POA: Diagnosis not present

## 2016-02-02 DIAGNOSIS — D509 Iron deficiency anemia, unspecified: Secondary | ICD-10-CM | POA: Diagnosis not present

## 2016-02-02 DIAGNOSIS — N2581 Secondary hyperparathyroidism of renal origin: Secondary | ICD-10-CM | POA: Diagnosis not present

## 2016-02-04 DIAGNOSIS — D509 Iron deficiency anemia, unspecified: Secondary | ICD-10-CM | POA: Diagnosis not present

## 2016-02-04 DIAGNOSIS — N2581 Secondary hyperparathyroidism of renal origin: Secondary | ICD-10-CM | POA: Diagnosis not present

## 2016-02-04 DIAGNOSIS — N186 End stage renal disease: Secondary | ICD-10-CM | POA: Diagnosis not present

## 2016-02-04 DIAGNOSIS — E611 Iron deficiency: Secondary | ICD-10-CM | POA: Diagnosis not present

## 2016-02-04 DIAGNOSIS — D631 Anemia in chronic kidney disease: Secondary | ICD-10-CM | POA: Diagnosis not present

## 2016-02-04 DIAGNOSIS — Z992 Dependence on renal dialysis: Secondary | ICD-10-CM | POA: Diagnosis not present

## 2016-02-06 DIAGNOSIS — N186 End stage renal disease: Secondary | ICD-10-CM | POA: Diagnosis not present

## 2016-02-06 DIAGNOSIS — D509 Iron deficiency anemia, unspecified: Secondary | ICD-10-CM | POA: Diagnosis not present

## 2016-02-06 DIAGNOSIS — Z992 Dependence on renal dialysis: Secondary | ICD-10-CM | POA: Diagnosis not present

## 2016-02-06 DIAGNOSIS — E611 Iron deficiency: Secondary | ICD-10-CM | POA: Diagnosis not present

## 2016-02-06 DIAGNOSIS — N2581 Secondary hyperparathyroidism of renal origin: Secondary | ICD-10-CM | POA: Diagnosis not present

## 2016-02-06 DIAGNOSIS — D631 Anemia in chronic kidney disease: Secondary | ICD-10-CM | POA: Diagnosis not present

## 2016-02-09 DIAGNOSIS — N2581 Secondary hyperparathyroidism of renal origin: Secondary | ICD-10-CM | POA: Diagnosis not present

## 2016-02-09 DIAGNOSIS — Z992 Dependence on renal dialysis: Secondary | ICD-10-CM | POA: Diagnosis not present

## 2016-02-09 DIAGNOSIS — D509 Iron deficiency anemia, unspecified: Secondary | ICD-10-CM | POA: Diagnosis not present

## 2016-02-09 DIAGNOSIS — N186 End stage renal disease: Secondary | ICD-10-CM | POA: Diagnosis not present

## 2016-02-09 DIAGNOSIS — D631 Anemia in chronic kidney disease: Secondary | ICD-10-CM | POA: Diagnosis not present

## 2016-02-09 DIAGNOSIS — E611 Iron deficiency: Secondary | ICD-10-CM | POA: Diagnosis not present

## 2016-02-11 DIAGNOSIS — D509 Iron deficiency anemia, unspecified: Secondary | ICD-10-CM | POA: Diagnosis not present

## 2016-02-11 DIAGNOSIS — N2581 Secondary hyperparathyroidism of renal origin: Secondary | ICD-10-CM | POA: Diagnosis not present

## 2016-02-11 DIAGNOSIS — Z992 Dependence on renal dialysis: Secondary | ICD-10-CM | POA: Diagnosis not present

## 2016-02-11 DIAGNOSIS — E611 Iron deficiency: Secondary | ICD-10-CM | POA: Diagnosis not present

## 2016-02-11 DIAGNOSIS — N186 End stage renal disease: Secondary | ICD-10-CM | POA: Diagnosis not present

## 2016-02-11 DIAGNOSIS — D631 Anemia in chronic kidney disease: Secondary | ICD-10-CM | POA: Diagnosis not present

## 2016-02-13 DIAGNOSIS — Z992 Dependence on renal dialysis: Secondary | ICD-10-CM | POA: Diagnosis not present

## 2016-02-13 DIAGNOSIS — D509 Iron deficiency anemia, unspecified: Secondary | ICD-10-CM | POA: Diagnosis not present

## 2016-02-13 DIAGNOSIS — D631 Anemia in chronic kidney disease: Secondary | ICD-10-CM | POA: Diagnosis not present

## 2016-02-13 DIAGNOSIS — E611 Iron deficiency: Secondary | ICD-10-CM | POA: Diagnosis not present

## 2016-02-13 DIAGNOSIS — N186 End stage renal disease: Secondary | ICD-10-CM | POA: Diagnosis not present

## 2016-02-13 DIAGNOSIS — N2581 Secondary hyperparathyroidism of renal origin: Secondary | ICD-10-CM | POA: Diagnosis not present

## 2016-02-16 DIAGNOSIS — E611 Iron deficiency: Secondary | ICD-10-CM | POA: Diagnosis not present

## 2016-02-16 DIAGNOSIS — Z992 Dependence on renal dialysis: Secondary | ICD-10-CM | POA: Diagnosis not present

## 2016-02-16 DIAGNOSIS — N186 End stage renal disease: Secondary | ICD-10-CM | POA: Diagnosis not present

## 2016-02-16 DIAGNOSIS — D631 Anemia in chronic kidney disease: Secondary | ICD-10-CM | POA: Diagnosis not present

## 2016-02-16 DIAGNOSIS — N2581 Secondary hyperparathyroidism of renal origin: Secondary | ICD-10-CM | POA: Diagnosis not present

## 2016-02-18 DIAGNOSIS — N2581 Secondary hyperparathyroidism of renal origin: Secondary | ICD-10-CM | POA: Diagnosis not present

## 2016-02-18 DIAGNOSIS — E611 Iron deficiency: Secondary | ICD-10-CM | POA: Diagnosis not present

## 2016-02-18 DIAGNOSIS — D631 Anemia in chronic kidney disease: Secondary | ICD-10-CM | POA: Diagnosis not present

## 2016-02-18 DIAGNOSIS — N186 End stage renal disease: Secondary | ICD-10-CM | POA: Diagnosis not present

## 2016-02-18 DIAGNOSIS — Z992 Dependence on renal dialysis: Secondary | ICD-10-CM | POA: Diagnosis not present

## 2016-02-20 DIAGNOSIS — N2581 Secondary hyperparathyroidism of renal origin: Secondary | ICD-10-CM | POA: Diagnosis not present

## 2016-02-20 DIAGNOSIS — N186 End stage renal disease: Secondary | ICD-10-CM | POA: Diagnosis not present

## 2016-02-20 DIAGNOSIS — Z992 Dependence on renal dialysis: Secondary | ICD-10-CM | POA: Diagnosis not present

## 2016-02-20 DIAGNOSIS — D631 Anemia in chronic kidney disease: Secondary | ICD-10-CM | POA: Diagnosis not present

## 2016-02-20 DIAGNOSIS — E611 Iron deficiency: Secondary | ICD-10-CM | POA: Diagnosis not present

## 2016-02-22 DIAGNOSIS — M5030 Other cervical disc degeneration, unspecified cervical region: Secondary | ICD-10-CM | POA: Diagnosis not present

## 2016-02-22 DIAGNOSIS — Z992 Dependence on renal dialysis: Secondary | ICD-10-CM | POA: Diagnosis not present

## 2016-02-22 DIAGNOSIS — M47812 Spondylosis without myelopathy or radiculopathy, cervical region: Secondary | ICD-10-CM | POA: Diagnosis not present

## 2016-02-22 DIAGNOSIS — I120 Hypertensive chronic kidney disease with stage 5 chronic kidney disease or end stage renal disease: Secondary | ICD-10-CM | POA: Diagnosis not present

## 2016-02-22 DIAGNOSIS — N186 End stage renal disease: Secondary | ICD-10-CM | POA: Diagnosis not present

## 2016-02-22 DIAGNOSIS — Z79899 Other long term (current) drug therapy: Secondary | ICD-10-CM | POA: Diagnosis not present

## 2016-02-23 DIAGNOSIS — N186 End stage renal disease: Secondary | ICD-10-CM | POA: Diagnosis not present

## 2016-02-23 DIAGNOSIS — D631 Anemia in chronic kidney disease: Secondary | ICD-10-CM | POA: Diagnosis not present

## 2016-02-23 DIAGNOSIS — Z992 Dependence on renal dialysis: Secondary | ICD-10-CM | POA: Diagnosis not present

## 2016-02-23 DIAGNOSIS — N2581 Secondary hyperparathyroidism of renal origin: Secondary | ICD-10-CM | POA: Diagnosis not present

## 2016-02-23 DIAGNOSIS — E611 Iron deficiency: Secondary | ICD-10-CM | POA: Diagnosis not present

## 2016-02-25 DIAGNOSIS — E611 Iron deficiency: Secondary | ICD-10-CM | POA: Diagnosis not present

## 2016-02-25 DIAGNOSIS — N2581 Secondary hyperparathyroidism of renal origin: Secondary | ICD-10-CM | POA: Diagnosis not present

## 2016-02-25 DIAGNOSIS — N186 End stage renal disease: Secondary | ICD-10-CM | POA: Diagnosis not present

## 2016-02-25 DIAGNOSIS — M542 Cervicalgia: Secondary | ICD-10-CM | POA: Diagnosis not present

## 2016-02-25 DIAGNOSIS — Z992 Dependence on renal dialysis: Secondary | ICD-10-CM | POA: Diagnosis not present

## 2016-02-25 DIAGNOSIS — D631 Anemia in chronic kidney disease: Secondary | ICD-10-CM | POA: Diagnosis not present

## 2016-02-27 DIAGNOSIS — E611 Iron deficiency: Secondary | ICD-10-CM | POA: Diagnosis not present

## 2016-02-27 DIAGNOSIS — D631 Anemia in chronic kidney disease: Secondary | ICD-10-CM | POA: Diagnosis not present

## 2016-02-27 DIAGNOSIS — N186 End stage renal disease: Secondary | ICD-10-CM | POA: Diagnosis not present

## 2016-02-27 DIAGNOSIS — N2581 Secondary hyperparathyroidism of renal origin: Secondary | ICD-10-CM | POA: Diagnosis not present

## 2016-02-27 DIAGNOSIS — Z992 Dependence on renal dialysis: Secondary | ICD-10-CM | POA: Diagnosis not present

## 2016-03-01 DIAGNOSIS — Z992 Dependence on renal dialysis: Secondary | ICD-10-CM | POA: Diagnosis not present

## 2016-03-01 DIAGNOSIS — N186 End stage renal disease: Secondary | ICD-10-CM | POA: Diagnosis not present

## 2016-03-01 DIAGNOSIS — D631 Anemia in chronic kidney disease: Secondary | ICD-10-CM | POA: Diagnosis not present

## 2016-03-01 DIAGNOSIS — E611 Iron deficiency: Secondary | ICD-10-CM | POA: Diagnosis not present

## 2016-03-01 DIAGNOSIS — N2581 Secondary hyperparathyroidism of renal origin: Secondary | ICD-10-CM | POA: Diagnosis not present

## 2016-03-03 DIAGNOSIS — N2581 Secondary hyperparathyroidism of renal origin: Secondary | ICD-10-CM | POA: Diagnosis not present

## 2016-03-03 DIAGNOSIS — N186 End stage renal disease: Secondary | ICD-10-CM | POA: Diagnosis not present

## 2016-03-03 DIAGNOSIS — D631 Anemia in chronic kidney disease: Secondary | ICD-10-CM | POA: Diagnosis not present

## 2016-03-03 DIAGNOSIS — Z992 Dependence on renal dialysis: Secondary | ICD-10-CM | POA: Diagnosis not present

## 2016-03-03 DIAGNOSIS — E611 Iron deficiency: Secondary | ICD-10-CM | POA: Diagnosis not present

## 2016-03-04 DIAGNOSIS — Z789 Other specified health status: Secondary | ICD-10-CM | POA: Diagnosis not present

## 2016-03-04 DIAGNOSIS — M542 Cervicalgia: Secondary | ICD-10-CM | POA: Diagnosis not present

## 2016-03-04 DIAGNOSIS — Z299 Encounter for prophylactic measures, unspecified: Secondary | ICD-10-CM | POA: Diagnosis not present

## 2016-03-04 DIAGNOSIS — J309 Allergic rhinitis, unspecified: Secondary | ICD-10-CM | POA: Diagnosis not present

## 2016-03-04 DIAGNOSIS — N19 Unspecified kidney failure: Secondary | ICD-10-CM | POA: Diagnosis not present

## 2016-03-05 DIAGNOSIS — Z992 Dependence on renal dialysis: Secondary | ICD-10-CM | POA: Diagnosis not present

## 2016-03-05 DIAGNOSIS — E611 Iron deficiency: Secondary | ICD-10-CM | POA: Diagnosis not present

## 2016-03-05 DIAGNOSIS — D631 Anemia in chronic kidney disease: Secondary | ICD-10-CM | POA: Diagnosis not present

## 2016-03-05 DIAGNOSIS — N2581 Secondary hyperparathyroidism of renal origin: Secondary | ICD-10-CM | POA: Diagnosis not present

## 2016-03-05 DIAGNOSIS — N186 End stage renal disease: Secondary | ICD-10-CM | POA: Diagnosis not present

## 2016-03-08 DIAGNOSIS — Z992 Dependence on renal dialysis: Secondary | ICD-10-CM | POA: Diagnosis not present

## 2016-03-08 DIAGNOSIS — N186 End stage renal disease: Secondary | ICD-10-CM | POA: Diagnosis not present

## 2016-03-08 DIAGNOSIS — N2581 Secondary hyperparathyroidism of renal origin: Secondary | ICD-10-CM | POA: Diagnosis not present

## 2016-03-08 DIAGNOSIS — D631 Anemia in chronic kidney disease: Secondary | ICD-10-CM | POA: Diagnosis not present

## 2016-03-08 DIAGNOSIS — E611 Iron deficiency: Secondary | ICD-10-CM | POA: Diagnosis not present

## 2016-03-10 DIAGNOSIS — E611 Iron deficiency: Secondary | ICD-10-CM | POA: Diagnosis not present

## 2016-03-10 DIAGNOSIS — D631 Anemia in chronic kidney disease: Secondary | ICD-10-CM | POA: Diagnosis not present

## 2016-03-10 DIAGNOSIS — Z992 Dependence on renal dialysis: Secondary | ICD-10-CM | POA: Diagnosis not present

## 2016-03-10 DIAGNOSIS — N2581 Secondary hyperparathyroidism of renal origin: Secondary | ICD-10-CM | POA: Diagnosis not present

## 2016-03-10 DIAGNOSIS — N186 End stage renal disease: Secondary | ICD-10-CM | POA: Diagnosis not present

## 2016-03-12 DIAGNOSIS — D631 Anemia in chronic kidney disease: Secondary | ICD-10-CM | POA: Diagnosis not present

## 2016-03-12 DIAGNOSIS — N2581 Secondary hyperparathyroidism of renal origin: Secondary | ICD-10-CM | POA: Diagnosis not present

## 2016-03-12 DIAGNOSIS — Z992 Dependence on renal dialysis: Secondary | ICD-10-CM | POA: Diagnosis not present

## 2016-03-12 DIAGNOSIS — E611 Iron deficiency: Secondary | ICD-10-CM | POA: Diagnosis not present

## 2016-03-12 DIAGNOSIS — N186 End stage renal disease: Secondary | ICD-10-CM | POA: Diagnosis not present

## 2016-03-14 DIAGNOSIS — Z992 Dependence on renal dialysis: Secondary | ICD-10-CM | POA: Diagnosis not present

## 2016-03-14 DIAGNOSIS — N186 End stage renal disease: Secondary | ICD-10-CM | POA: Diagnosis not present

## 2016-03-15 DIAGNOSIS — N186 End stage renal disease: Secondary | ICD-10-CM | POA: Diagnosis not present

## 2016-03-15 DIAGNOSIS — D631 Anemia in chronic kidney disease: Secondary | ICD-10-CM | POA: Diagnosis not present

## 2016-03-15 DIAGNOSIS — Z992 Dependence on renal dialysis: Secondary | ICD-10-CM | POA: Diagnosis not present

## 2016-03-15 DIAGNOSIS — N2581 Secondary hyperparathyroidism of renal origin: Secondary | ICD-10-CM | POA: Diagnosis not present

## 2016-03-17 DIAGNOSIS — Z992 Dependence on renal dialysis: Secondary | ICD-10-CM | POA: Diagnosis not present

## 2016-03-17 DIAGNOSIS — N2581 Secondary hyperparathyroidism of renal origin: Secondary | ICD-10-CM | POA: Diagnosis not present

## 2016-03-17 DIAGNOSIS — N186 End stage renal disease: Secondary | ICD-10-CM | POA: Diagnosis not present

## 2016-03-17 DIAGNOSIS — D631 Anemia in chronic kidney disease: Secondary | ICD-10-CM | POA: Diagnosis not present

## 2016-03-19 DIAGNOSIS — N2581 Secondary hyperparathyroidism of renal origin: Secondary | ICD-10-CM | POA: Diagnosis not present

## 2016-03-19 DIAGNOSIS — Z992 Dependence on renal dialysis: Secondary | ICD-10-CM | POA: Diagnosis not present

## 2016-03-19 DIAGNOSIS — N186 End stage renal disease: Secondary | ICD-10-CM | POA: Diagnosis not present

## 2016-03-19 DIAGNOSIS — D631 Anemia in chronic kidney disease: Secondary | ICD-10-CM | POA: Diagnosis not present

## 2016-03-22 DIAGNOSIS — Z992 Dependence on renal dialysis: Secondary | ICD-10-CM | POA: Diagnosis not present

## 2016-03-22 DIAGNOSIS — N2581 Secondary hyperparathyroidism of renal origin: Secondary | ICD-10-CM | POA: Diagnosis not present

## 2016-03-22 DIAGNOSIS — N186 End stage renal disease: Secondary | ICD-10-CM | POA: Diagnosis not present

## 2016-03-22 DIAGNOSIS — D631 Anemia in chronic kidney disease: Secondary | ICD-10-CM | POA: Diagnosis not present

## 2016-03-24 DIAGNOSIS — N186 End stage renal disease: Secondary | ICD-10-CM | POA: Diagnosis not present

## 2016-03-24 DIAGNOSIS — D631 Anemia in chronic kidney disease: Secondary | ICD-10-CM | POA: Diagnosis not present

## 2016-03-24 DIAGNOSIS — N2581 Secondary hyperparathyroidism of renal origin: Secondary | ICD-10-CM | POA: Diagnosis not present

## 2016-03-24 DIAGNOSIS — Z992 Dependence on renal dialysis: Secondary | ICD-10-CM | POA: Diagnosis not present

## 2016-03-26 DIAGNOSIS — N186 End stage renal disease: Secondary | ICD-10-CM | POA: Diagnosis not present

## 2016-03-26 DIAGNOSIS — Z992 Dependence on renal dialysis: Secondary | ICD-10-CM | POA: Diagnosis not present

## 2016-03-26 DIAGNOSIS — N2581 Secondary hyperparathyroidism of renal origin: Secondary | ICD-10-CM | POA: Diagnosis not present

## 2016-03-26 DIAGNOSIS — D631 Anemia in chronic kidney disease: Secondary | ICD-10-CM | POA: Diagnosis not present

## 2016-03-29 DIAGNOSIS — Z992 Dependence on renal dialysis: Secondary | ICD-10-CM | POA: Diagnosis not present

## 2016-03-29 DIAGNOSIS — N2581 Secondary hyperparathyroidism of renal origin: Secondary | ICD-10-CM | POA: Diagnosis not present

## 2016-03-29 DIAGNOSIS — N186 End stage renal disease: Secondary | ICD-10-CM | POA: Diagnosis not present

## 2016-03-29 DIAGNOSIS — D631 Anemia in chronic kidney disease: Secondary | ICD-10-CM | POA: Diagnosis not present

## 2016-03-31 DIAGNOSIS — N2581 Secondary hyperparathyroidism of renal origin: Secondary | ICD-10-CM | POA: Diagnosis not present

## 2016-03-31 DIAGNOSIS — N186 End stage renal disease: Secondary | ICD-10-CM | POA: Diagnosis not present

## 2016-03-31 DIAGNOSIS — Z992 Dependence on renal dialysis: Secondary | ICD-10-CM | POA: Diagnosis not present

## 2016-03-31 DIAGNOSIS — D631 Anemia in chronic kidney disease: Secondary | ICD-10-CM | POA: Diagnosis not present

## 2016-04-02 DIAGNOSIS — D631 Anemia in chronic kidney disease: Secondary | ICD-10-CM | POA: Diagnosis not present

## 2016-04-02 DIAGNOSIS — N186 End stage renal disease: Secondary | ICD-10-CM | POA: Diagnosis not present

## 2016-04-02 DIAGNOSIS — Z992 Dependence on renal dialysis: Secondary | ICD-10-CM | POA: Diagnosis not present

## 2016-04-02 DIAGNOSIS — N2581 Secondary hyperparathyroidism of renal origin: Secondary | ICD-10-CM | POA: Diagnosis not present

## 2016-04-05 DIAGNOSIS — Z992 Dependence on renal dialysis: Secondary | ICD-10-CM | POA: Diagnosis not present

## 2016-04-05 DIAGNOSIS — N186 End stage renal disease: Secondary | ICD-10-CM | POA: Diagnosis not present

## 2016-04-05 DIAGNOSIS — N2581 Secondary hyperparathyroidism of renal origin: Secondary | ICD-10-CM | POA: Diagnosis not present

## 2016-04-05 DIAGNOSIS — D631 Anemia in chronic kidney disease: Secondary | ICD-10-CM | POA: Diagnosis not present

## 2016-04-07 DIAGNOSIS — N2581 Secondary hyperparathyroidism of renal origin: Secondary | ICD-10-CM | POA: Diagnosis not present

## 2016-04-07 DIAGNOSIS — N186 End stage renal disease: Secondary | ICD-10-CM | POA: Diagnosis not present

## 2016-04-07 DIAGNOSIS — Z992 Dependence on renal dialysis: Secondary | ICD-10-CM | POA: Diagnosis not present

## 2016-04-07 DIAGNOSIS — D631 Anemia in chronic kidney disease: Secondary | ICD-10-CM | POA: Diagnosis not present

## 2016-04-09 DIAGNOSIS — Z992 Dependence on renal dialysis: Secondary | ICD-10-CM | POA: Diagnosis not present

## 2016-04-09 DIAGNOSIS — D631 Anemia in chronic kidney disease: Secondary | ICD-10-CM | POA: Diagnosis not present

## 2016-04-09 DIAGNOSIS — N186 End stage renal disease: Secondary | ICD-10-CM | POA: Diagnosis not present

## 2016-04-09 DIAGNOSIS — N2581 Secondary hyperparathyroidism of renal origin: Secondary | ICD-10-CM | POA: Diagnosis not present

## 2016-04-12 DIAGNOSIS — N2581 Secondary hyperparathyroidism of renal origin: Secondary | ICD-10-CM | POA: Diagnosis not present

## 2016-04-12 DIAGNOSIS — N186 End stage renal disease: Secondary | ICD-10-CM | POA: Diagnosis not present

## 2016-04-12 DIAGNOSIS — D631 Anemia in chronic kidney disease: Secondary | ICD-10-CM | POA: Diagnosis not present

## 2016-04-12 DIAGNOSIS — Z992 Dependence on renal dialysis: Secondary | ICD-10-CM | POA: Diagnosis not present

## 2016-04-14 DIAGNOSIS — D631 Anemia in chronic kidney disease: Secondary | ICD-10-CM | POA: Diagnosis not present

## 2016-04-14 DIAGNOSIS — Z992 Dependence on renal dialysis: Secondary | ICD-10-CM | POA: Diagnosis not present

## 2016-04-14 DIAGNOSIS — N2581 Secondary hyperparathyroidism of renal origin: Secondary | ICD-10-CM | POA: Diagnosis not present

## 2016-04-14 DIAGNOSIS — N186 End stage renal disease: Secondary | ICD-10-CM | POA: Diagnosis not present

## 2016-04-16 DIAGNOSIS — N2581 Secondary hyperparathyroidism of renal origin: Secondary | ICD-10-CM | POA: Diagnosis not present

## 2016-04-16 DIAGNOSIS — D509 Iron deficiency anemia, unspecified: Secondary | ICD-10-CM | POA: Diagnosis not present

## 2016-04-16 DIAGNOSIS — D631 Anemia in chronic kidney disease: Secondary | ICD-10-CM | POA: Diagnosis not present

## 2016-04-16 DIAGNOSIS — N186 End stage renal disease: Secondary | ICD-10-CM | POA: Diagnosis not present

## 2016-04-16 DIAGNOSIS — Z992 Dependence on renal dialysis: Secondary | ICD-10-CM | POA: Diagnosis not present

## 2016-04-19 DIAGNOSIS — N186 End stage renal disease: Secondary | ICD-10-CM | POA: Diagnosis not present

## 2016-04-19 DIAGNOSIS — D509 Iron deficiency anemia, unspecified: Secondary | ICD-10-CM | POA: Diagnosis not present

## 2016-04-19 DIAGNOSIS — Z992 Dependence on renal dialysis: Secondary | ICD-10-CM | POA: Diagnosis not present

## 2016-04-19 DIAGNOSIS — N2581 Secondary hyperparathyroidism of renal origin: Secondary | ICD-10-CM | POA: Diagnosis not present

## 2016-04-19 DIAGNOSIS — D631 Anemia in chronic kidney disease: Secondary | ICD-10-CM | POA: Diagnosis not present

## 2016-04-21 DIAGNOSIS — D631 Anemia in chronic kidney disease: Secondary | ICD-10-CM | POA: Diagnosis not present

## 2016-04-21 DIAGNOSIS — Z992 Dependence on renal dialysis: Secondary | ICD-10-CM | POA: Diagnosis not present

## 2016-04-21 DIAGNOSIS — D509 Iron deficiency anemia, unspecified: Secondary | ICD-10-CM | POA: Diagnosis not present

## 2016-04-21 DIAGNOSIS — N2581 Secondary hyperparathyroidism of renal origin: Secondary | ICD-10-CM | POA: Diagnosis not present

## 2016-04-21 DIAGNOSIS — N186 End stage renal disease: Secondary | ICD-10-CM | POA: Diagnosis not present

## 2016-04-23 DIAGNOSIS — N186 End stage renal disease: Secondary | ICD-10-CM | POA: Diagnosis not present

## 2016-04-23 DIAGNOSIS — N2581 Secondary hyperparathyroidism of renal origin: Secondary | ICD-10-CM | POA: Diagnosis not present

## 2016-04-23 DIAGNOSIS — D509 Iron deficiency anemia, unspecified: Secondary | ICD-10-CM | POA: Diagnosis not present

## 2016-04-23 DIAGNOSIS — Z992 Dependence on renal dialysis: Secondary | ICD-10-CM | POA: Diagnosis not present

## 2016-04-23 DIAGNOSIS — D631 Anemia in chronic kidney disease: Secondary | ICD-10-CM | POA: Diagnosis not present

## 2016-04-26 DIAGNOSIS — Z992 Dependence on renal dialysis: Secondary | ICD-10-CM | POA: Diagnosis not present

## 2016-04-26 DIAGNOSIS — N186 End stage renal disease: Secondary | ICD-10-CM | POA: Diagnosis not present

## 2016-04-26 DIAGNOSIS — N2581 Secondary hyperparathyroidism of renal origin: Secondary | ICD-10-CM | POA: Diagnosis not present

## 2016-04-26 DIAGNOSIS — D509 Iron deficiency anemia, unspecified: Secondary | ICD-10-CM | POA: Diagnosis not present

## 2016-04-26 DIAGNOSIS — D631 Anemia in chronic kidney disease: Secondary | ICD-10-CM | POA: Diagnosis not present

## 2016-04-28 DIAGNOSIS — Z992 Dependence on renal dialysis: Secondary | ICD-10-CM | POA: Diagnosis not present

## 2016-04-28 DIAGNOSIS — N186 End stage renal disease: Secondary | ICD-10-CM | POA: Diagnosis not present

## 2016-04-28 DIAGNOSIS — D631 Anemia in chronic kidney disease: Secondary | ICD-10-CM | POA: Diagnosis not present

## 2016-04-28 DIAGNOSIS — D509 Iron deficiency anemia, unspecified: Secondary | ICD-10-CM | POA: Diagnosis not present

## 2016-04-28 DIAGNOSIS — N2581 Secondary hyperparathyroidism of renal origin: Secondary | ICD-10-CM | POA: Diagnosis not present

## 2016-04-30 DIAGNOSIS — D631 Anemia in chronic kidney disease: Secondary | ICD-10-CM | POA: Diagnosis not present

## 2016-04-30 DIAGNOSIS — Z992 Dependence on renal dialysis: Secondary | ICD-10-CM | POA: Diagnosis not present

## 2016-04-30 DIAGNOSIS — D509 Iron deficiency anemia, unspecified: Secondary | ICD-10-CM | POA: Diagnosis not present

## 2016-04-30 DIAGNOSIS — N2581 Secondary hyperparathyroidism of renal origin: Secondary | ICD-10-CM | POA: Diagnosis not present

## 2016-04-30 DIAGNOSIS — N186 End stage renal disease: Secondary | ICD-10-CM | POA: Diagnosis not present

## 2016-05-03 DIAGNOSIS — D509 Iron deficiency anemia, unspecified: Secondary | ICD-10-CM | POA: Diagnosis not present

## 2016-05-03 DIAGNOSIS — N186 End stage renal disease: Secondary | ICD-10-CM | POA: Diagnosis not present

## 2016-05-03 DIAGNOSIS — Z992 Dependence on renal dialysis: Secondary | ICD-10-CM | POA: Diagnosis not present

## 2016-05-03 DIAGNOSIS — D631 Anemia in chronic kidney disease: Secondary | ICD-10-CM | POA: Diagnosis not present

## 2016-05-03 DIAGNOSIS — N2581 Secondary hyperparathyroidism of renal origin: Secondary | ICD-10-CM | POA: Diagnosis not present

## 2016-05-05 DIAGNOSIS — Z992 Dependence on renal dialysis: Secondary | ICD-10-CM | POA: Diagnosis not present

## 2016-05-05 DIAGNOSIS — N186 End stage renal disease: Secondary | ICD-10-CM | POA: Diagnosis not present

## 2016-05-05 DIAGNOSIS — N2581 Secondary hyperparathyroidism of renal origin: Secondary | ICD-10-CM | POA: Diagnosis not present

## 2016-05-05 DIAGNOSIS — D509 Iron deficiency anemia, unspecified: Secondary | ICD-10-CM | POA: Diagnosis not present

## 2016-05-05 DIAGNOSIS — D631 Anemia in chronic kidney disease: Secondary | ICD-10-CM | POA: Diagnosis not present

## 2016-05-07 DIAGNOSIS — D631 Anemia in chronic kidney disease: Secondary | ICD-10-CM | POA: Diagnosis not present

## 2016-05-07 DIAGNOSIS — D509 Iron deficiency anemia, unspecified: Secondary | ICD-10-CM | POA: Diagnosis not present

## 2016-05-07 DIAGNOSIS — N2581 Secondary hyperparathyroidism of renal origin: Secondary | ICD-10-CM | POA: Diagnosis not present

## 2016-05-07 DIAGNOSIS — Z992 Dependence on renal dialysis: Secondary | ICD-10-CM | POA: Diagnosis not present

## 2016-05-07 DIAGNOSIS — N186 End stage renal disease: Secondary | ICD-10-CM | POA: Diagnosis not present

## 2016-05-10 DIAGNOSIS — D509 Iron deficiency anemia, unspecified: Secondary | ICD-10-CM | POA: Diagnosis not present

## 2016-05-10 DIAGNOSIS — Z992 Dependence on renal dialysis: Secondary | ICD-10-CM | POA: Diagnosis not present

## 2016-05-10 DIAGNOSIS — D631 Anemia in chronic kidney disease: Secondary | ICD-10-CM | POA: Diagnosis not present

## 2016-05-10 DIAGNOSIS — N2581 Secondary hyperparathyroidism of renal origin: Secondary | ICD-10-CM | POA: Diagnosis not present

## 2016-05-10 DIAGNOSIS — N186 End stage renal disease: Secondary | ICD-10-CM | POA: Diagnosis not present

## 2016-05-12 DIAGNOSIS — D631 Anemia in chronic kidney disease: Secondary | ICD-10-CM | POA: Diagnosis not present

## 2016-05-12 DIAGNOSIS — N2581 Secondary hyperparathyroidism of renal origin: Secondary | ICD-10-CM | POA: Diagnosis not present

## 2016-05-12 DIAGNOSIS — D509 Iron deficiency anemia, unspecified: Secondary | ICD-10-CM | POA: Diagnosis not present

## 2016-05-12 DIAGNOSIS — Z992 Dependence on renal dialysis: Secondary | ICD-10-CM | POA: Diagnosis not present

## 2016-05-12 DIAGNOSIS — N186 End stage renal disease: Secondary | ICD-10-CM | POA: Diagnosis not present

## 2016-05-14 DIAGNOSIS — N2581 Secondary hyperparathyroidism of renal origin: Secondary | ICD-10-CM | POA: Diagnosis not present

## 2016-05-14 DIAGNOSIS — D631 Anemia in chronic kidney disease: Secondary | ICD-10-CM | POA: Diagnosis not present

## 2016-05-14 DIAGNOSIS — D509 Iron deficiency anemia, unspecified: Secondary | ICD-10-CM | POA: Diagnosis not present

## 2016-05-14 DIAGNOSIS — N186 End stage renal disease: Secondary | ICD-10-CM | POA: Diagnosis not present

## 2016-05-14 DIAGNOSIS — Z992 Dependence on renal dialysis: Secondary | ICD-10-CM | POA: Diagnosis not present

## 2016-05-17 DIAGNOSIS — N186 End stage renal disease: Secondary | ICD-10-CM | POA: Diagnosis not present

## 2016-05-17 DIAGNOSIS — E611 Iron deficiency: Secondary | ICD-10-CM | POA: Diagnosis not present

## 2016-05-17 DIAGNOSIS — Z992 Dependence on renal dialysis: Secondary | ICD-10-CM | POA: Diagnosis not present

## 2016-05-17 DIAGNOSIS — N2581 Secondary hyperparathyroidism of renal origin: Secondary | ICD-10-CM | POA: Diagnosis not present

## 2016-05-17 DIAGNOSIS — D631 Anemia in chronic kidney disease: Secondary | ICD-10-CM | POA: Diagnosis not present

## 2016-05-19 DIAGNOSIS — Z992 Dependence on renal dialysis: Secondary | ICD-10-CM | POA: Diagnosis not present

## 2016-05-19 DIAGNOSIS — D631 Anemia in chronic kidney disease: Secondary | ICD-10-CM | POA: Diagnosis not present

## 2016-05-19 DIAGNOSIS — N186 End stage renal disease: Secondary | ICD-10-CM | POA: Diagnosis not present

## 2016-05-19 DIAGNOSIS — N2581 Secondary hyperparathyroidism of renal origin: Secondary | ICD-10-CM | POA: Diagnosis not present

## 2016-05-19 DIAGNOSIS — E611 Iron deficiency: Secondary | ICD-10-CM | POA: Diagnosis not present

## 2016-05-21 DIAGNOSIS — Z79899 Other long term (current) drug therapy: Secondary | ICD-10-CM | POA: Diagnosis not present

## 2016-05-21 DIAGNOSIS — R52 Pain, unspecified: Secondary | ICD-10-CM | POA: Diagnosis not present

## 2016-05-21 DIAGNOSIS — T82868A Thrombosis of vascular prosthetic devices, implants and grafts, initial encounter: Secondary | ICD-10-CM | POA: Diagnosis not present

## 2016-05-21 DIAGNOSIS — T82898A Other specified complication of vascular prosthetic devices, implants and grafts, initial encounter: Secondary | ICD-10-CM | POA: Diagnosis not present

## 2016-05-21 DIAGNOSIS — T82858A Stenosis of vascular prosthetic devices, implants and grafts, initial encounter: Secondary | ICD-10-CM | POA: Diagnosis not present

## 2016-05-22 DIAGNOSIS — E611 Iron deficiency: Secondary | ICD-10-CM | POA: Diagnosis not present

## 2016-05-22 DIAGNOSIS — Z992 Dependence on renal dialysis: Secondary | ICD-10-CM | POA: Diagnosis not present

## 2016-05-22 DIAGNOSIS — D631 Anemia in chronic kidney disease: Secondary | ICD-10-CM | POA: Diagnosis not present

## 2016-05-22 DIAGNOSIS — N2581 Secondary hyperparathyroidism of renal origin: Secondary | ICD-10-CM | POA: Diagnosis not present

## 2016-05-22 DIAGNOSIS — N186 End stage renal disease: Secondary | ICD-10-CM | POA: Diagnosis not present

## 2016-05-24 DIAGNOSIS — E611 Iron deficiency: Secondary | ICD-10-CM | POA: Diagnosis not present

## 2016-05-24 DIAGNOSIS — N2581 Secondary hyperparathyroidism of renal origin: Secondary | ICD-10-CM | POA: Diagnosis not present

## 2016-05-24 DIAGNOSIS — D631 Anemia in chronic kidney disease: Secondary | ICD-10-CM | POA: Diagnosis not present

## 2016-05-24 DIAGNOSIS — N186 End stage renal disease: Secondary | ICD-10-CM | POA: Diagnosis not present

## 2016-05-24 DIAGNOSIS — Z992 Dependence on renal dialysis: Secondary | ICD-10-CM | POA: Diagnosis not present

## 2016-05-26 DIAGNOSIS — D631 Anemia in chronic kidney disease: Secondary | ICD-10-CM | POA: Diagnosis not present

## 2016-05-26 DIAGNOSIS — E611 Iron deficiency: Secondary | ICD-10-CM | POA: Diagnosis not present

## 2016-05-26 DIAGNOSIS — N2581 Secondary hyperparathyroidism of renal origin: Secondary | ICD-10-CM | POA: Diagnosis not present

## 2016-05-26 DIAGNOSIS — N186 End stage renal disease: Secondary | ICD-10-CM | POA: Diagnosis not present

## 2016-05-26 DIAGNOSIS — Z992 Dependence on renal dialysis: Secondary | ICD-10-CM | POA: Diagnosis not present

## 2016-05-28 DIAGNOSIS — N2581 Secondary hyperparathyroidism of renal origin: Secondary | ICD-10-CM | POA: Diagnosis not present

## 2016-05-28 DIAGNOSIS — Z992 Dependence on renal dialysis: Secondary | ICD-10-CM | POA: Diagnosis not present

## 2016-05-28 DIAGNOSIS — E611 Iron deficiency: Secondary | ICD-10-CM | POA: Diagnosis not present

## 2016-05-28 DIAGNOSIS — D631 Anemia in chronic kidney disease: Secondary | ICD-10-CM | POA: Diagnosis not present

## 2016-05-28 DIAGNOSIS — N186 End stage renal disease: Secondary | ICD-10-CM | POA: Diagnosis not present

## 2016-05-31 DIAGNOSIS — Z992 Dependence on renal dialysis: Secondary | ICD-10-CM | POA: Diagnosis not present

## 2016-05-31 DIAGNOSIS — N2581 Secondary hyperparathyroidism of renal origin: Secondary | ICD-10-CM | POA: Diagnosis not present

## 2016-05-31 DIAGNOSIS — D631 Anemia in chronic kidney disease: Secondary | ICD-10-CM | POA: Diagnosis not present

## 2016-05-31 DIAGNOSIS — E611 Iron deficiency: Secondary | ICD-10-CM | POA: Diagnosis not present

## 2016-05-31 DIAGNOSIS — N186 End stage renal disease: Secondary | ICD-10-CM | POA: Diagnosis not present

## 2016-06-02 DIAGNOSIS — D631 Anemia in chronic kidney disease: Secondary | ICD-10-CM | POA: Diagnosis not present

## 2016-06-02 DIAGNOSIS — N186 End stage renal disease: Secondary | ICD-10-CM | POA: Diagnosis not present

## 2016-06-02 DIAGNOSIS — E611 Iron deficiency: Secondary | ICD-10-CM | POA: Diagnosis not present

## 2016-06-02 DIAGNOSIS — Z992 Dependence on renal dialysis: Secondary | ICD-10-CM | POA: Diagnosis not present

## 2016-06-02 DIAGNOSIS — N2581 Secondary hyperparathyroidism of renal origin: Secondary | ICD-10-CM | POA: Diagnosis not present

## 2016-06-04 DIAGNOSIS — N2581 Secondary hyperparathyroidism of renal origin: Secondary | ICD-10-CM | POA: Diagnosis not present

## 2016-06-04 DIAGNOSIS — N186 End stage renal disease: Secondary | ICD-10-CM | POA: Diagnosis not present

## 2016-06-04 DIAGNOSIS — D631 Anemia in chronic kidney disease: Secondary | ICD-10-CM | POA: Diagnosis not present

## 2016-06-04 DIAGNOSIS — Z992 Dependence on renal dialysis: Secondary | ICD-10-CM | POA: Diagnosis not present

## 2016-06-04 DIAGNOSIS — E611 Iron deficiency: Secondary | ICD-10-CM | POA: Diagnosis not present

## 2016-06-07 DIAGNOSIS — N186 End stage renal disease: Secondary | ICD-10-CM | POA: Diagnosis not present

## 2016-06-07 DIAGNOSIS — E611 Iron deficiency: Secondary | ICD-10-CM | POA: Diagnosis not present

## 2016-06-07 DIAGNOSIS — Z992 Dependence on renal dialysis: Secondary | ICD-10-CM | POA: Diagnosis not present

## 2016-06-07 DIAGNOSIS — D631 Anemia in chronic kidney disease: Secondary | ICD-10-CM | POA: Diagnosis not present

## 2016-06-07 DIAGNOSIS — N2581 Secondary hyperparathyroidism of renal origin: Secondary | ICD-10-CM | POA: Diagnosis not present

## 2016-06-09 DIAGNOSIS — N2581 Secondary hyperparathyroidism of renal origin: Secondary | ICD-10-CM | POA: Diagnosis not present

## 2016-06-09 DIAGNOSIS — Z992 Dependence on renal dialysis: Secondary | ICD-10-CM | POA: Diagnosis not present

## 2016-06-09 DIAGNOSIS — D631 Anemia in chronic kidney disease: Secondary | ICD-10-CM | POA: Diagnosis not present

## 2016-06-09 DIAGNOSIS — E611 Iron deficiency: Secondary | ICD-10-CM | POA: Diagnosis not present

## 2016-06-09 DIAGNOSIS — N186 End stage renal disease: Secondary | ICD-10-CM | POA: Diagnosis not present

## 2016-06-11 DIAGNOSIS — E611 Iron deficiency: Secondary | ICD-10-CM | POA: Diagnosis not present

## 2016-06-11 DIAGNOSIS — Z992 Dependence on renal dialysis: Secondary | ICD-10-CM | POA: Diagnosis not present

## 2016-06-11 DIAGNOSIS — D631 Anemia in chronic kidney disease: Secondary | ICD-10-CM | POA: Diagnosis not present

## 2016-06-11 DIAGNOSIS — N2581 Secondary hyperparathyroidism of renal origin: Secondary | ICD-10-CM | POA: Diagnosis not present

## 2016-06-11 DIAGNOSIS — N186 End stage renal disease: Secondary | ICD-10-CM | POA: Diagnosis not present

## 2016-06-14 DIAGNOSIS — D631 Anemia in chronic kidney disease: Secondary | ICD-10-CM | POA: Diagnosis not present

## 2016-06-14 DIAGNOSIS — N186 End stage renal disease: Secondary | ICD-10-CM | POA: Diagnosis not present

## 2016-06-14 DIAGNOSIS — E611 Iron deficiency: Secondary | ICD-10-CM | POA: Diagnosis not present

## 2016-06-14 DIAGNOSIS — Z992 Dependence on renal dialysis: Secondary | ICD-10-CM | POA: Diagnosis not present

## 2016-06-14 DIAGNOSIS — N2581 Secondary hyperparathyroidism of renal origin: Secondary | ICD-10-CM | POA: Diagnosis not present

## 2016-06-16 DIAGNOSIS — D631 Anemia in chronic kidney disease: Secondary | ICD-10-CM | POA: Diagnosis not present

## 2016-06-16 DIAGNOSIS — N186 End stage renal disease: Secondary | ICD-10-CM | POA: Diagnosis not present

## 2016-06-16 DIAGNOSIS — E611 Iron deficiency: Secondary | ICD-10-CM | POA: Diagnosis not present

## 2016-06-16 DIAGNOSIS — Z992 Dependence on renal dialysis: Secondary | ICD-10-CM | POA: Diagnosis not present

## 2016-06-16 DIAGNOSIS — N2581 Secondary hyperparathyroidism of renal origin: Secondary | ICD-10-CM | POA: Diagnosis not present

## 2016-06-18 DIAGNOSIS — N186 End stage renal disease: Secondary | ICD-10-CM | POA: Diagnosis not present

## 2016-06-18 DIAGNOSIS — E611 Iron deficiency: Secondary | ICD-10-CM | POA: Diagnosis not present

## 2016-06-18 DIAGNOSIS — N2581 Secondary hyperparathyroidism of renal origin: Secondary | ICD-10-CM | POA: Diagnosis not present

## 2016-06-18 DIAGNOSIS — D631 Anemia in chronic kidney disease: Secondary | ICD-10-CM | POA: Diagnosis not present

## 2016-06-18 DIAGNOSIS — Z992 Dependence on renal dialysis: Secondary | ICD-10-CM | POA: Diagnosis not present

## 2016-06-21 DIAGNOSIS — E611 Iron deficiency: Secondary | ICD-10-CM | POA: Diagnosis not present

## 2016-06-21 DIAGNOSIS — Z992 Dependence on renal dialysis: Secondary | ICD-10-CM | POA: Diagnosis not present

## 2016-06-21 DIAGNOSIS — N2581 Secondary hyperparathyroidism of renal origin: Secondary | ICD-10-CM | POA: Diagnosis not present

## 2016-06-21 DIAGNOSIS — N186 End stage renal disease: Secondary | ICD-10-CM | POA: Diagnosis not present

## 2016-06-21 DIAGNOSIS — D631 Anemia in chronic kidney disease: Secondary | ICD-10-CM | POA: Diagnosis not present

## 2016-06-22 DIAGNOSIS — Z789 Other specified health status: Secondary | ICD-10-CM | POA: Diagnosis not present

## 2016-06-22 DIAGNOSIS — Z1211 Encounter for screening for malignant neoplasm of colon: Secondary | ICD-10-CM | POA: Diagnosis not present

## 2016-06-22 DIAGNOSIS — E7800 Pure hypercholesterolemia, unspecified: Secondary | ICD-10-CM | POA: Diagnosis not present

## 2016-06-22 DIAGNOSIS — R0602 Shortness of breath: Secondary | ICD-10-CM | POA: Diagnosis not present

## 2016-06-22 DIAGNOSIS — Z992 Dependence on renal dialysis: Secondary | ICD-10-CM | POA: Diagnosis not present

## 2016-06-22 DIAGNOSIS — Z6836 Body mass index (BMI) 36.0-36.9, adult: Secondary | ICD-10-CM | POA: Diagnosis not present

## 2016-06-23 DIAGNOSIS — E611 Iron deficiency: Secondary | ICD-10-CM | POA: Diagnosis not present

## 2016-06-23 DIAGNOSIS — N2581 Secondary hyperparathyroidism of renal origin: Secondary | ICD-10-CM | POA: Diagnosis not present

## 2016-06-23 DIAGNOSIS — N186 End stage renal disease: Secondary | ICD-10-CM | POA: Diagnosis not present

## 2016-06-23 DIAGNOSIS — Z992 Dependence on renal dialysis: Secondary | ICD-10-CM | POA: Diagnosis not present

## 2016-06-23 DIAGNOSIS — D631 Anemia in chronic kidney disease: Secondary | ICD-10-CM | POA: Diagnosis not present

## 2016-06-25 DIAGNOSIS — E611 Iron deficiency: Secondary | ICD-10-CM | POA: Diagnosis not present

## 2016-06-25 DIAGNOSIS — Z992 Dependence on renal dialysis: Secondary | ICD-10-CM | POA: Diagnosis not present

## 2016-06-25 DIAGNOSIS — N186 End stage renal disease: Secondary | ICD-10-CM | POA: Diagnosis not present

## 2016-06-25 DIAGNOSIS — D631 Anemia in chronic kidney disease: Secondary | ICD-10-CM | POA: Diagnosis not present

## 2016-06-25 DIAGNOSIS — N2581 Secondary hyperparathyroidism of renal origin: Secondary | ICD-10-CM | POA: Diagnosis not present

## 2016-06-28 DIAGNOSIS — Z992 Dependence on renal dialysis: Secondary | ICD-10-CM | POA: Diagnosis not present

## 2016-06-28 DIAGNOSIS — N186 End stage renal disease: Secondary | ICD-10-CM | POA: Diagnosis not present

## 2016-06-28 DIAGNOSIS — N2581 Secondary hyperparathyroidism of renal origin: Secondary | ICD-10-CM | POA: Diagnosis not present

## 2016-06-28 DIAGNOSIS — D631 Anemia in chronic kidney disease: Secondary | ICD-10-CM | POA: Diagnosis not present

## 2016-06-28 DIAGNOSIS — E611 Iron deficiency: Secondary | ICD-10-CM | POA: Diagnosis not present

## 2016-06-30 DIAGNOSIS — Z992 Dependence on renal dialysis: Secondary | ICD-10-CM | POA: Diagnosis not present

## 2016-06-30 DIAGNOSIS — N2581 Secondary hyperparathyroidism of renal origin: Secondary | ICD-10-CM | POA: Diagnosis not present

## 2016-06-30 DIAGNOSIS — N186 End stage renal disease: Secondary | ICD-10-CM | POA: Diagnosis not present

## 2016-06-30 DIAGNOSIS — E611 Iron deficiency: Secondary | ICD-10-CM | POA: Diagnosis not present

## 2016-06-30 DIAGNOSIS — D631 Anemia in chronic kidney disease: Secondary | ICD-10-CM | POA: Diagnosis not present

## 2016-07-01 DIAGNOSIS — R0602 Shortness of breath: Secondary | ICD-10-CM | POA: Diagnosis not present

## 2016-07-02 DIAGNOSIS — N2581 Secondary hyperparathyroidism of renal origin: Secondary | ICD-10-CM | POA: Diagnosis not present

## 2016-07-02 DIAGNOSIS — Z992 Dependence on renal dialysis: Secondary | ICD-10-CM | POA: Diagnosis not present

## 2016-07-02 DIAGNOSIS — D631 Anemia in chronic kidney disease: Secondary | ICD-10-CM | POA: Diagnosis not present

## 2016-07-02 DIAGNOSIS — E611 Iron deficiency: Secondary | ICD-10-CM | POA: Diagnosis not present

## 2016-07-02 DIAGNOSIS — N186 End stage renal disease: Secondary | ICD-10-CM | POA: Diagnosis not present

## 2016-07-05 DIAGNOSIS — N2581 Secondary hyperparathyroidism of renal origin: Secondary | ICD-10-CM | POA: Diagnosis not present

## 2016-07-05 DIAGNOSIS — Z992 Dependence on renal dialysis: Secondary | ICD-10-CM | POA: Diagnosis not present

## 2016-07-05 DIAGNOSIS — N186 End stage renal disease: Secondary | ICD-10-CM | POA: Diagnosis not present

## 2016-07-05 DIAGNOSIS — D631 Anemia in chronic kidney disease: Secondary | ICD-10-CM | POA: Diagnosis not present

## 2016-07-05 DIAGNOSIS — E611 Iron deficiency: Secondary | ICD-10-CM | POA: Diagnosis not present

## 2016-07-06 DIAGNOSIS — T82858A Stenosis of vascular prosthetic devices, implants and grafts, initial encounter: Secondary | ICD-10-CM | POA: Diagnosis not present

## 2016-07-06 DIAGNOSIS — R52 Pain, unspecified: Secondary | ICD-10-CM | POA: Diagnosis not present

## 2016-07-07 DIAGNOSIS — E611 Iron deficiency: Secondary | ICD-10-CM | POA: Diagnosis not present

## 2016-07-07 DIAGNOSIS — Z992 Dependence on renal dialysis: Secondary | ICD-10-CM | POA: Diagnosis not present

## 2016-07-07 DIAGNOSIS — D631 Anemia in chronic kidney disease: Secondary | ICD-10-CM | POA: Diagnosis not present

## 2016-07-07 DIAGNOSIS — N186 End stage renal disease: Secondary | ICD-10-CM | POA: Diagnosis not present

## 2016-07-07 DIAGNOSIS — N2581 Secondary hyperparathyroidism of renal origin: Secondary | ICD-10-CM | POA: Diagnosis not present

## 2016-07-09 DIAGNOSIS — Z992 Dependence on renal dialysis: Secondary | ICD-10-CM | POA: Diagnosis not present

## 2016-07-09 DIAGNOSIS — N2581 Secondary hyperparathyroidism of renal origin: Secondary | ICD-10-CM | POA: Diagnosis not present

## 2016-07-09 DIAGNOSIS — D631 Anemia in chronic kidney disease: Secondary | ICD-10-CM | POA: Diagnosis not present

## 2016-07-09 DIAGNOSIS — N186 End stage renal disease: Secondary | ICD-10-CM | POA: Diagnosis not present

## 2016-07-09 DIAGNOSIS — E611 Iron deficiency: Secondary | ICD-10-CM | POA: Diagnosis not present

## 2016-07-12 DIAGNOSIS — D631 Anemia in chronic kidney disease: Secondary | ICD-10-CM | POA: Diagnosis not present

## 2016-07-12 DIAGNOSIS — E611 Iron deficiency: Secondary | ICD-10-CM | POA: Diagnosis not present

## 2016-07-12 DIAGNOSIS — N186 End stage renal disease: Secondary | ICD-10-CM | POA: Diagnosis not present

## 2016-07-12 DIAGNOSIS — N2581 Secondary hyperparathyroidism of renal origin: Secondary | ICD-10-CM | POA: Diagnosis not present

## 2016-07-12 DIAGNOSIS — Z992 Dependence on renal dialysis: Secondary | ICD-10-CM | POA: Diagnosis not present

## 2016-07-14 ENCOUNTER — Other Ambulatory Visit: Payer: Self-pay | Admitting: Internal Medicine

## 2016-07-14 DIAGNOSIS — D631 Anemia in chronic kidney disease: Secondary | ICD-10-CM | POA: Diagnosis not present

## 2016-07-14 DIAGNOSIS — R921 Mammographic calcification found on diagnostic imaging of breast: Secondary | ICD-10-CM | POA: Diagnosis not present

## 2016-07-14 DIAGNOSIS — N2581 Secondary hyperparathyroidism of renal origin: Secondary | ICD-10-CM | POA: Diagnosis not present

## 2016-07-14 DIAGNOSIS — Z992 Dependence on renal dialysis: Secondary | ICD-10-CM | POA: Diagnosis not present

## 2016-07-14 DIAGNOSIS — N186 End stage renal disease: Secondary | ICD-10-CM | POA: Diagnosis not present

## 2016-07-14 DIAGNOSIS — E611 Iron deficiency: Secondary | ICD-10-CM | POA: Diagnosis not present

## 2016-07-15 DIAGNOSIS — Z992 Dependence on renal dialysis: Secondary | ICD-10-CM | POA: Diagnosis not present

## 2016-07-15 DIAGNOSIS — N186 End stage renal disease: Secondary | ICD-10-CM | POA: Diagnosis not present

## 2016-07-16 DIAGNOSIS — D509 Iron deficiency anemia, unspecified: Secondary | ICD-10-CM | POA: Diagnosis not present

## 2016-07-16 DIAGNOSIS — Z992 Dependence on renal dialysis: Secondary | ICD-10-CM | POA: Diagnosis not present

## 2016-07-16 DIAGNOSIS — N2581 Secondary hyperparathyroidism of renal origin: Secondary | ICD-10-CM | POA: Diagnosis not present

## 2016-07-16 DIAGNOSIS — N186 End stage renal disease: Secondary | ICD-10-CM | POA: Diagnosis not present

## 2016-07-16 DIAGNOSIS — Z23 Encounter for immunization: Secondary | ICD-10-CM | POA: Diagnosis not present

## 2016-07-16 DIAGNOSIS — D631 Anemia in chronic kidney disease: Secondary | ICD-10-CM | POA: Diagnosis not present

## 2016-07-19 DIAGNOSIS — Z992 Dependence on renal dialysis: Secondary | ICD-10-CM | POA: Diagnosis not present

## 2016-07-19 DIAGNOSIS — D631 Anemia in chronic kidney disease: Secondary | ICD-10-CM | POA: Diagnosis not present

## 2016-07-19 DIAGNOSIS — N2581 Secondary hyperparathyroidism of renal origin: Secondary | ICD-10-CM | POA: Diagnosis not present

## 2016-07-19 DIAGNOSIS — N186 End stage renal disease: Secondary | ICD-10-CM | POA: Diagnosis not present

## 2016-07-19 DIAGNOSIS — Z23 Encounter for immunization: Secondary | ICD-10-CM | POA: Diagnosis not present

## 2016-07-19 DIAGNOSIS — D509 Iron deficiency anemia, unspecified: Secondary | ICD-10-CM | POA: Diagnosis not present

## 2016-07-21 DIAGNOSIS — N186 End stage renal disease: Secondary | ICD-10-CM | POA: Diagnosis not present

## 2016-07-21 DIAGNOSIS — D631 Anemia in chronic kidney disease: Secondary | ICD-10-CM | POA: Diagnosis not present

## 2016-07-21 DIAGNOSIS — Z23 Encounter for immunization: Secondary | ICD-10-CM | POA: Diagnosis not present

## 2016-07-21 DIAGNOSIS — Z992 Dependence on renal dialysis: Secondary | ICD-10-CM | POA: Diagnosis not present

## 2016-07-21 DIAGNOSIS — N2581 Secondary hyperparathyroidism of renal origin: Secondary | ICD-10-CM | POA: Diagnosis not present

## 2016-07-21 DIAGNOSIS — D509 Iron deficiency anemia, unspecified: Secondary | ICD-10-CM | POA: Diagnosis not present

## 2016-07-23 DIAGNOSIS — D631 Anemia in chronic kidney disease: Secondary | ICD-10-CM | POA: Diagnosis not present

## 2016-07-23 DIAGNOSIS — Z23 Encounter for immunization: Secondary | ICD-10-CM | POA: Diagnosis not present

## 2016-07-23 DIAGNOSIS — N2581 Secondary hyperparathyroidism of renal origin: Secondary | ICD-10-CM | POA: Diagnosis not present

## 2016-07-23 DIAGNOSIS — Z992 Dependence on renal dialysis: Secondary | ICD-10-CM | POA: Diagnosis not present

## 2016-07-23 DIAGNOSIS — D509 Iron deficiency anemia, unspecified: Secondary | ICD-10-CM | POA: Diagnosis not present

## 2016-07-23 DIAGNOSIS — N186 End stage renal disease: Secondary | ICD-10-CM | POA: Diagnosis not present

## 2016-07-25 DIAGNOSIS — N2581 Secondary hyperparathyroidism of renal origin: Secondary | ICD-10-CM | POA: Diagnosis not present

## 2016-07-25 DIAGNOSIS — D509 Iron deficiency anemia, unspecified: Secondary | ICD-10-CM | POA: Diagnosis not present

## 2016-07-25 DIAGNOSIS — D631 Anemia in chronic kidney disease: Secondary | ICD-10-CM | POA: Diagnosis not present

## 2016-07-25 DIAGNOSIS — Z23 Encounter for immunization: Secondary | ICD-10-CM | POA: Diagnosis not present

## 2016-07-25 DIAGNOSIS — N186 End stage renal disease: Secondary | ICD-10-CM | POA: Diagnosis not present

## 2016-07-25 DIAGNOSIS — Z992 Dependence on renal dialysis: Secondary | ICD-10-CM | POA: Diagnosis not present

## 2016-07-27 ENCOUNTER — Other Ambulatory Visit: Payer: Self-pay | Admitting: Internal Medicine

## 2016-07-27 ENCOUNTER — Ambulatory Visit
Admission: RE | Admit: 2016-07-27 | Discharge: 2016-07-27 | Disposition: A | Discharge status: Home / Self Care | Payer: Medicare Other | Source: Ambulatory Visit | Attending: Internal Medicine | Admitting: Internal Medicine

## 2016-07-27 DIAGNOSIS — R921 Mammographic calcification found on diagnostic imaging of breast: Secondary | ICD-10-CM

## 2016-07-28 DIAGNOSIS — D631 Anemia in chronic kidney disease: Secondary | ICD-10-CM | POA: Diagnosis not present

## 2016-07-28 DIAGNOSIS — N2581 Secondary hyperparathyroidism of renal origin: Secondary | ICD-10-CM | POA: Diagnosis not present

## 2016-07-28 DIAGNOSIS — D509 Iron deficiency anemia, unspecified: Secondary | ICD-10-CM | POA: Diagnosis not present

## 2016-07-28 DIAGNOSIS — Z992 Dependence on renal dialysis: Secondary | ICD-10-CM | POA: Diagnosis not present

## 2016-07-28 DIAGNOSIS — N186 End stage renal disease: Secondary | ICD-10-CM | POA: Diagnosis not present

## 2016-07-28 DIAGNOSIS — Z23 Encounter for immunization: Secondary | ICD-10-CM | POA: Diagnosis not present

## 2016-07-30 DIAGNOSIS — N2581 Secondary hyperparathyroidism of renal origin: Secondary | ICD-10-CM | POA: Diagnosis not present

## 2016-07-30 DIAGNOSIS — Z992 Dependence on renal dialysis: Secondary | ICD-10-CM | POA: Diagnosis not present

## 2016-07-30 DIAGNOSIS — Z23 Encounter for immunization: Secondary | ICD-10-CM | POA: Diagnosis not present

## 2016-07-30 DIAGNOSIS — D509 Iron deficiency anemia, unspecified: Secondary | ICD-10-CM | POA: Diagnosis not present

## 2016-07-30 DIAGNOSIS — N186 End stage renal disease: Secondary | ICD-10-CM | POA: Diagnosis not present

## 2016-07-30 DIAGNOSIS — D631 Anemia in chronic kidney disease: Secondary | ICD-10-CM | POA: Diagnosis not present

## 2016-08-02 DIAGNOSIS — D631 Anemia in chronic kidney disease: Secondary | ICD-10-CM | POA: Diagnosis not present

## 2016-08-02 DIAGNOSIS — D509 Iron deficiency anemia, unspecified: Secondary | ICD-10-CM | POA: Diagnosis not present

## 2016-08-02 DIAGNOSIS — Z992 Dependence on renal dialysis: Secondary | ICD-10-CM | POA: Diagnosis not present

## 2016-08-02 DIAGNOSIS — Z23 Encounter for immunization: Secondary | ICD-10-CM | POA: Diagnosis not present

## 2016-08-02 DIAGNOSIS — N186 End stage renal disease: Secondary | ICD-10-CM | POA: Diagnosis not present

## 2016-08-02 DIAGNOSIS — N2581 Secondary hyperparathyroidism of renal origin: Secondary | ICD-10-CM | POA: Diagnosis not present

## 2016-08-04 DIAGNOSIS — Z992 Dependence on renal dialysis: Secondary | ICD-10-CM | POA: Diagnosis not present

## 2016-08-04 DIAGNOSIS — D631 Anemia in chronic kidney disease: Secondary | ICD-10-CM | POA: Diagnosis not present

## 2016-08-04 DIAGNOSIS — N186 End stage renal disease: Secondary | ICD-10-CM | POA: Diagnosis not present

## 2016-08-04 DIAGNOSIS — D509 Iron deficiency anemia, unspecified: Secondary | ICD-10-CM | POA: Diagnosis not present

## 2016-08-04 DIAGNOSIS — N2581 Secondary hyperparathyroidism of renal origin: Secondary | ICD-10-CM | POA: Diagnosis not present

## 2016-08-04 DIAGNOSIS — Z23 Encounter for immunization: Secondary | ICD-10-CM | POA: Diagnosis not present

## 2016-08-06 DIAGNOSIS — D509 Iron deficiency anemia, unspecified: Secondary | ICD-10-CM | POA: Diagnosis not present

## 2016-08-06 DIAGNOSIS — N186 End stage renal disease: Secondary | ICD-10-CM | POA: Diagnosis not present

## 2016-08-06 DIAGNOSIS — Z23 Encounter for immunization: Secondary | ICD-10-CM | POA: Diagnosis not present

## 2016-08-06 DIAGNOSIS — N2581 Secondary hyperparathyroidism of renal origin: Secondary | ICD-10-CM | POA: Diagnosis not present

## 2016-08-06 DIAGNOSIS — Z992 Dependence on renal dialysis: Secondary | ICD-10-CM | POA: Diagnosis not present

## 2016-08-06 DIAGNOSIS — D631 Anemia in chronic kidney disease: Secondary | ICD-10-CM | POA: Diagnosis not present

## 2016-08-09 DIAGNOSIS — D631 Anemia in chronic kidney disease: Secondary | ICD-10-CM | POA: Diagnosis not present

## 2016-08-09 DIAGNOSIS — N2581 Secondary hyperparathyroidism of renal origin: Secondary | ICD-10-CM | POA: Diagnosis not present

## 2016-08-09 DIAGNOSIS — N186 End stage renal disease: Secondary | ICD-10-CM | POA: Diagnosis not present

## 2016-08-09 DIAGNOSIS — Z992 Dependence on renal dialysis: Secondary | ICD-10-CM | POA: Diagnosis not present

## 2016-08-09 DIAGNOSIS — D509 Iron deficiency anemia, unspecified: Secondary | ICD-10-CM | POA: Diagnosis not present

## 2016-08-09 DIAGNOSIS — Z23 Encounter for immunization: Secondary | ICD-10-CM | POA: Diagnosis not present

## 2016-08-11 DIAGNOSIS — N186 End stage renal disease: Secondary | ICD-10-CM | POA: Diagnosis not present

## 2016-08-11 DIAGNOSIS — Z992 Dependence on renal dialysis: Secondary | ICD-10-CM | POA: Diagnosis not present

## 2016-08-11 DIAGNOSIS — D631 Anemia in chronic kidney disease: Secondary | ICD-10-CM | POA: Diagnosis not present

## 2016-08-11 DIAGNOSIS — D509 Iron deficiency anemia, unspecified: Secondary | ICD-10-CM | POA: Diagnosis not present

## 2016-08-11 DIAGNOSIS — Z23 Encounter for immunization: Secondary | ICD-10-CM | POA: Diagnosis not present

## 2016-08-11 DIAGNOSIS — N2581 Secondary hyperparathyroidism of renal origin: Secondary | ICD-10-CM | POA: Diagnosis not present

## 2016-08-13 DIAGNOSIS — Z23 Encounter for immunization: Secondary | ICD-10-CM | POA: Diagnosis not present

## 2016-08-13 DIAGNOSIS — D509 Iron deficiency anemia, unspecified: Secondary | ICD-10-CM | POA: Diagnosis not present

## 2016-08-13 DIAGNOSIS — N2581 Secondary hyperparathyroidism of renal origin: Secondary | ICD-10-CM | POA: Diagnosis not present

## 2016-08-13 DIAGNOSIS — Z992 Dependence on renal dialysis: Secondary | ICD-10-CM | POA: Diagnosis not present

## 2016-08-13 DIAGNOSIS — D631 Anemia in chronic kidney disease: Secondary | ICD-10-CM | POA: Diagnosis not present

## 2016-08-13 DIAGNOSIS — N186 End stage renal disease: Secondary | ICD-10-CM | POA: Diagnosis not present

## 2016-08-14 DIAGNOSIS — N186 End stage renal disease: Secondary | ICD-10-CM | POA: Diagnosis not present

## 2016-08-14 DIAGNOSIS — Z992 Dependence on renal dialysis: Secondary | ICD-10-CM | POA: Diagnosis not present

## 2016-08-16 DIAGNOSIS — Z992 Dependence on renal dialysis: Secondary | ICD-10-CM | POA: Diagnosis not present

## 2016-08-16 DIAGNOSIS — E611 Iron deficiency: Secondary | ICD-10-CM | POA: Diagnosis not present

## 2016-08-16 DIAGNOSIS — N186 End stage renal disease: Secondary | ICD-10-CM | POA: Diagnosis not present

## 2016-08-16 DIAGNOSIS — D631 Anemia in chronic kidney disease: Secondary | ICD-10-CM | POA: Diagnosis not present

## 2016-08-16 DIAGNOSIS — N2581 Secondary hyperparathyroidism of renal origin: Secondary | ICD-10-CM | POA: Diagnosis not present

## 2016-08-18 DIAGNOSIS — D631 Anemia in chronic kidney disease: Secondary | ICD-10-CM | POA: Diagnosis not present

## 2016-08-18 DIAGNOSIS — Z992 Dependence on renal dialysis: Secondary | ICD-10-CM | POA: Diagnosis not present

## 2016-08-18 DIAGNOSIS — E611 Iron deficiency: Secondary | ICD-10-CM | POA: Diagnosis not present

## 2016-08-18 DIAGNOSIS — N186 End stage renal disease: Secondary | ICD-10-CM | POA: Diagnosis not present

## 2016-08-18 DIAGNOSIS — N2581 Secondary hyperparathyroidism of renal origin: Secondary | ICD-10-CM | POA: Diagnosis not present

## 2016-08-20 ENCOUNTER — Ambulatory Visit (HOSPITAL_COMMUNITY)
Admission: RE | Admit: 2016-08-20 | Discharge: 2016-08-20 | Disposition: A | Discharge status: Home / Self Care | Payer: Medicare Other | Source: Ambulatory Visit | Attending: Vascular Surgery | Admitting: Vascular Surgery

## 2016-08-20 ENCOUNTER — Ambulatory Visit (HOSPITAL_COMMUNITY): Payer: Medicare Other | Admitting: Anesthesiology

## 2016-08-20 ENCOUNTER — Ambulatory Visit (HOSPITAL_COMMUNITY): Payer: Medicare Other

## 2016-08-20 ENCOUNTER — Encounter (HOSPITAL_COMMUNITY)
Admission: RE | Disposition: A | Discharge status: Home / Self Care | Payer: Self-pay | Source: Ambulatory Visit | Attending: Vascular Surgery

## 2016-08-20 ENCOUNTER — Encounter (HOSPITAL_COMMUNITY): Payer: Self-pay | Admitting: Urology

## 2016-08-20 DIAGNOSIS — K219 Gastro-esophageal reflux disease without esophagitis: Secondary | ICD-10-CM | POA: Insufficient documentation

## 2016-08-20 DIAGNOSIS — T82868A Thrombosis of vascular prosthetic devices, implants and grafts, initial encounter: Secondary | ICD-10-CM | POA: Insufficient documentation

## 2016-08-20 DIAGNOSIS — D649 Anemia, unspecified: Secondary | ICD-10-CM | POA: Diagnosis not present

## 2016-08-20 DIAGNOSIS — N186 End stage renal disease: Secondary | ICD-10-CM | POA: Diagnosis not present

## 2016-08-20 DIAGNOSIS — Z79899 Other long term (current) drug therapy: Secondary | ICD-10-CM | POA: Diagnosis not present

## 2016-08-20 DIAGNOSIS — Z87442 Personal history of urinary calculi: Secondary | ICD-10-CM | POA: Diagnosis not present

## 2016-08-20 DIAGNOSIS — Y828 Other medical devices associated with adverse incidents: Secondary | ICD-10-CM | POA: Insufficient documentation

## 2016-08-20 DIAGNOSIS — Z992 Dependence on renal dialysis: Secondary | ICD-10-CM | POA: Insufficient documentation

## 2016-08-20 DIAGNOSIS — Z419 Encounter for procedure for purposes other than remedying health state, unspecified: Secondary | ICD-10-CM

## 2016-08-20 DIAGNOSIS — J986 Disorders of diaphragm: Secondary | ICD-10-CM | POA: Diagnosis not present

## 2016-08-20 DIAGNOSIS — N2581 Secondary hyperparathyroidism of renal origin: Secondary | ICD-10-CM | POA: Diagnosis not present

## 2016-08-20 DIAGNOSIS — Z01810 Encounter for preprocedural cardiovascular examination: Secondary | ICD-10-CM

## 2016-08-20 HISTORY — PX: ANGIOPLASTY: SHX39

## 2016-08-20 HISTORY — DX: Gastro-esophageal reflux disease without esophagitis: K21.9

## 2016-08-20 HISTORY — PX: THROMBECTOMY AND REVISION OF ARTERIOVENTOUS (AV) GORETEX  GRAFT: SHX6120

## 2016-08-20 LAB — POCT I-STAT 4, (NA,K, GLUC, HGB,HCT)
GLUCOSE: 83 mg/dL (ref 65–99)
HEMATOCRIT: 35 % — AB (ref 36.0–46.0)
Hemoglobin: 11.9 g/dL — ABNORMAL LOW (ref 12.0–15.0)
Potassium: 5.5 mmol/L — ABNORMAL HIGH (ref 3.5–5.1)
Sodium: 136 mmol/L (ref 135–145)

## 2016-08-20 SURGERY — THROMBECTOMY AND REVISION OF ARTERIOVENTOUS (AV) GORETEX  GRAFT
Anesthesia: Monitor Anesthesia Care | Site: Arm Upper

## 2016-08-20 MED ORDER — PHENYLEPHRINE HCL 10 MG/ML IJ SOLN
INTRAVENOUS | Status: DC | PRN
  Administered 2016-08-20: 20 ug/min via INTRAVENOUS

## 2016-08-20 MED ORDER — MIDAZOLAM HCL 5 MG/5ML IJ SOLN
INTRAMUSCULAR | Status: DC | PRN
  Administered 2016-08-20: 2 mg via INTRAVENOUS

## 2016-08-20 MED ORDER — LACTATED RINGERS IV SOLN: INTRAVENOUS | Status: DC

## 2016-08-20 MED ORDER — FENTANYL CITRATE (PF) 100 MCG/2ML IJ SOLN: 25.0000 ug | INTRAMUSCULAR | Status: DC | PRN

## 2016-08-20 MED ORDER — SODIUM CHLORIDE 0.9 % IV SOLN
INTRAVENOUS | Status: DC | PRN
  Administered 2016-08-20: 15:00:00

## 2016-08-20 MED ORDER — PROPOFOL 1000 MG/100ML IV EMUL
INTRAVENOUS | Status: AC
  Filled 2016-08-20: qty 200

## 2016-08-20 MED ORDER — CEFAZOLIN SODIUM 1 G IJ SOLR
INTRAMUSCULAR | Status: DC | PRN
  Administered 2016-08-20: 2 g via INTRAMUSCULAR

## 2016-08-20 MED ORDER — IOPAMIDOL (ISOVUE-300) INJECTION 61%
INTRAVENOUS | Status: DC | PRN
  Administered 2016-08-20: 65 mL via INTRA_ARTERIAL

## 2016-08-20 MED ORDER — FENTANYL CITRATE (PF) 100 MCG/2ML IJ SOLN
INTRAMUSCULAR | Status: DC | PRN
  Administered 2016-08-20 (×4): 50 ug via INTRAVENOUS

## 2016-08-20 MED ORDER — PROPOFOL 500 MG/50ML IV EMUL
INTRAVENOUS | Status: DC | PRN
  Administered 2016-08-20: 100 ug/kg/min via INTRAVENOUS
  Administered 2016-08-20: 16:00:00 via INTRAVENOUS

## 2016-08-20 MED ORDER — FENTANYL CITRATE (PF) 100 MCG/2ML IJ SOLN
INTRAMUSCULAR | Status: AC
  Filled 2016-08-20: qty 2

## 2016-08-20 MED ORDER — METOCLOPRAMIDE HCL 5 MG/ML IJ SOLN: 10.0000 mg | Freq: Once | INTRAMUSCULAR | Status: DC | PRN

## 2016-08-20 MED ORDER — PROPOFOL 10 MG/ML IV BOLUS
INTRAVENOUS | Status: DC | PRN
  Administered 2016-08-20 (×2): 20 mg via INTRAVENOUS
  Administered 2016-08-20: 10 mg via INTRAVENOUS

## 2016-08-20 MED ORDER — SODIUM CHLORIDE 0.9 % IV SOLN
INTRAVENOUS | Status: DC
  Administered 2016-08-20 (×2): via INTRAVENOUS

## 2016-08-20 MED ORDER — HEPARIN SODIUM (PORCINE) 1000 UNIT/ML IJ SOLN
INTRAMUSCULAR | Status: AC
  Filled 2016-08-20: qty 1

## 2016-08-20 MED ORDER — LIDOCAINE HCL (PF) 1 % IJ SOLN
INTRAMUSCULAR | Status: DC | PRN
  Administered 2016-08-20: 10 mL

## 2016-08-20 MED ORDER — 0.9 % SODIUM CHLORIDE (POUR BTL) OPTIME
TOPICAL | Status: DC | PRN
  Administered 2016-08-20: 1000 mL

## 2016-08-20 MED ORDER — OXYCODONE-ACETAMINOPHEN 5-325 MG PO TABS: 1.0000 | ORAL_TABLET | Freq: Four times a day (QID) | ORAL | 0 refills | Status: AC | PRN

## 2016-08-20 MED ORDER — LIDOCAINE HCL (PF) 1 % IJ SOLN
INTRAMUSCULAR | Status: AC
  Filled 2016-08-20: qty 30

## 2016-08-20 MED ORDER — PHENYLEPHRINE HCL 10 MG/ML IJ SOLN
INTRAMUSCULAR | Status: DC | PRN
  Administered 2016-08-20 (×3): 120 ug via INTRAVENOUS

## 2016-08-20 MED ORDER — MIDAZOLAM HCL 2 MG/2ML IJ SOLN
INTRAMUSCULAR | Status: AC
  Filled 2016-08-20: qty 2

## 2016-08-20 SURGICAL SUPPLY — 65 items
ARMBAND PINK RESTRICT EXTREMIT (MISCELLANEOUS) ×6 IMPLANT
BAG DECANTER FOR FLEXI CONT (MISCELLANEOUS) ×3 IMPLANT
BALLN MUSTANG 12.0X40 75 (BALLOONS) ×3
BALLN MUSTANG 7.0X40 135 (BALLOONS) ×3
BALLOON MUSTANG 12.0X40 75 (BALLOONS) ×2 IMPLANT
BALLOON MUSTANG 7.0X40 135 (BALLOONS) ×2 IMPLANT
BIOPATCH RED 1 DISK 7.0 (GAUZE/BANDAGES/DRESSINGS) IMPLANT
CANISTER SUCTION 2500CC (MISCELLANEOUS) ×3 IMPLANT
CANNULA VESSEL 3MM 2 BLNT TIP (CANNULA) ×3 IMPLANT
CATH EMB 4FR 80CM (CATHETERS) ×3 IMPLANT
CATH PALINDROME RT-P 15FX19CM (CATHETERS) IMPLANT
CATH PALINDROME RT-P 15FX23CM (CATHETERS) IMPLANT
CATH PALINDROME RT-P 15FX28CM (CATHETERS) IMPLANT
CATH PALINDROME RT-P 15FX55CM (CATHETERS) IMPLANT
CATH STRAIGHT 5FR 65CM (CATHETERS) IMPLANT
CHLORAPREP W/TINT 26ML (MISCELLANEOUS) IMPLANT
CLIP TI MEDIUM 6 (CLIP) ×3 IMPLANT
CLIP TI WIDE RED SMALL 6 (CLIP) ×3 IMPLANT
COVER PROBE W GEL 5X96 (DRAPES) ×3 IMPLANT
DERMABOND ADVANCED (GAUZE/BANDAGES/DRESSINGS) ×1
DERMABOND ADVANCED .7 DNX12 (GAUZE/BANDAGES/DRESSINGS) ×2 IMPLANT
DRAPE C-ARM 42X72 X-RAY (DRAPES) IMPLANT
DRAPE CHEST BREAST 15X10 FENES (DRAPES) ×3 IMPLANT
DRAPE X-RAY CASS 24X20 (DRAPES) IMPLANT
ELECT REM PT RETURN 9FT ADLT (ELECTROSURGICAL) ×3
ELECTRODE REM PT RTRN 9FT ADLT (ELECTROSURGICAL) ×2 IMPLANT
GEL ULTRASOUND 20GR AQUASONIC (MISCELLANEOUS) IMPLANT
GLOVE BIO SURGEON STRL SZ7.5 (GLOVE) ×3 IMPLANT
GLOVE BIOGEL PI IND STRL 6.5 (GLOVE) ×6 IMPLANT
GLOVE BIOGEL PI IND STRL 7.5 (GLOVE) ×2 IMPLANT
GLOVE BIOGEL PI INDICATOR 6.5 (GLOVE) ×3
GLOVE BIOGEL PI INDICATOR 7.5 (GLOVE) ×1
GLOVE SURG SS PI 6.5 STRL IVOR (GLOVE) ×6 IMPLANT
GOWN STRL REUS W/ TWL LRG LVL3 (GOWN DISPOSABLE) ×8 IMPLANT
GOWN STRL REUS W/TWL LRG LVL3 (GOWN DISPOSABLE) ×4
HEMOSTAT SPONGE AVITENE ULTRA (HEMOSTASIS) IMPLANT
KIT BASIN OR (CUSTOM PROCEDURE TRAY) ×3 IMPLANT
KIT ENCORE 26 ADVANTAGE (KITS) ×3 IMPLANT
KIT ROOM TURNOVER OR (KITS) ×3 IMPLANT
LIQUID BAND (GAUZE/BANDAGES/DRESSINGS) ×3 IMPLANT
LOOP VESSEL MINI RED (MISCELLANEOUS) IMPLANT
NEEDLE 18GX1X1/2 (RX/OR ONLY) (NEEDLE) IMPLANT
NEEDLE HYPO 25GX1X1/2 BEV (NEEDLE) IMPLANT
NS IRRIG 1000ML POUR BTL (IV SOLUTION) ×3 IMPLANT
PACK CV ACCESS (CUSTOM PROCEDURE TRAY) ×3 IMPLANT
PAD ARMBOARD 7.5X6 YLW CONV (MISCELLANEOUS) ×6 IMPLANT
SET COLLECT BLD 21X3/4 12 (NEEDLE) IMPLANT
SET MICROPUNCTURE 5F STIFF (MISCELLANEOUS) ×3 IMPLANT
SHEATH PINNACLE R/O II 6F 4CM (SHEATH) ×3 IMPLANT
SPONGE LAP 18X18 X RAY DECT (DISPOSABLE) ×3 IMPLANT
STOPCOCK 4 WAY LG BORE MALE ST (IV SETS) ×3 IMPLANT
SUT ETHILON 3 0 PS 1 (SUTURE) ×3 IMPLANT
SUT PROLENE 6 0 CC (SUTURE) ×6 IMPLANT
SUT SILK 0 FSL (SUTURE) IMPLANT
SUT VIC AB 3-0 SH 27 (SUTURE) ×1
SUT VIC AB 3-0 SH 27X BRD (SUTURE) ×2 IMPLANT
SUT VICRYL 4-0 PS2 18IN ABS (SUTURE) ×3 IMPLANT
SYR 20CC LL (SYRINGE) ×3 IMPLANT
SYR 5ML LL (SYRINGE) ×3 IMPLANT
SYRINGE 10CC LL (SYRINGE) IMPLANT
TUBING EXTENTION W/L.L. (IV SETS) ×3 IMPLANT
UNDERPAD 30X30 (UNDERPADS AND DIAPERS) ×3 IMPLANT
WATER STERILE IRR 1000ML POUR (IV SOLUTION) ×3 IMPLANT
WIRE AMPLATZ SS-J .035X180CM (WIRE) IMPLANT
WIRE HI TORQ VERSACORE J 260CM (WIRE) ×3 IMPLANT

## 2016-08-20 NOTE — Anesthesia Procedure Notes (Signed)
Procedure Name: MAC Date/Time: 08/20/2016 2:46 PM Performed by: Salli Quarry Jaquil Todt Pre-anesthesia Checklist: Patient identified, Emergency Drugs available, Suction available and Patient being monitored Patient Re-evaluated:Patient Re-evaluated prior to inductionOxygen Delivery Method: Nasal cannula

## 2016-08-20 NOTE — Op Note (Addendum)
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  Operative details: After obtaining informed consent, the patient standing in the operating room. The patient was placed in supine position operating table. After adequate sedation the patient's entire left upper extremity was prepped and draped in usual sterile fashion. Ultrasound was used to localize the patient's left upper arm AV fistula. A transverse incision was made near the antecubital crease over the proximal aspect of the fistula. The incision was carried down through the subcutaneous tissues to the level of the fistula. The fistula was dissected free circumferentially. A vessel loop was placed around this. The fistula diameter adjacent to the artery was approximately 4-5 mm. The patient was given 5000 units of intravenous heparin. The fistula was controlled proximally and distally with fistula clamps. A transverse venotomy was made in the fistula. A #4 Fogarty catheter was then used to thrombectomize the proximal aspect of the fistula and there was excellent arterial inflow. An arterialized plug was retrieved. I then passed a #4 Fogarty catheter multiple times up the venous limb of the fistula into the central venous system. A large amount of thrombus was removed. I could easily tell that there was resistance near the left shoulder region on the balloon. I was able to clear all the thrombus and get good venous backbleeding. At this point the venotomy was reapproximated using a running 6-0 Prolene suture. I then used a micropuncture needle to puncture the fistula  just above the suture line. Micropuncture wire was threaded through this and a micropuncture sheath placed over the wire. Contrast angiogram was then obtained through the micropuncture sheath. This showed a patent central venous system with a stent in the subclavian vein adjacent to the humeral head extending to the clavicle. There was in-stent restenosis with an 80% narrowing at the distal aspect of the stent and a 70% stenosis of the proximal aspect of the stent. At this point an [place] wire was placed through the micropuncture sheath the sheath was removed and exchanged for a short 6 [place] sheath. This was thoroughly flushed with heparin saline. A 7 x 4 angioplasty balloon was brought up in the operative field and advanced to the distal stenosis. I then inflated this to 10 atm but it was obvious that there was minimal waist on this and I need a larger balloon. Therefore the 7 mm balloon was exchanged for a 12 x 4 mm angioplasty balloon. This was then centered on the distal restenosis site and straddled the end of the stent. This was then inflated to 10 atm for 1 minute. The balloon was then pulled back to the proximal aspect of the stent and then I also inflated this to 10 atm for 1 minute. Completion angiogram showed minimal stenosis at the proximal and distal aspects of the stent with good flow into the central venous system. At this point hemostasis was obtained by placing a pursestring 6-0 Prolene suture around the sheath and the sheath was removed. There is an easily palpable thrill in the fistula. The subcutaneous tissues were reapproximated using a running 3-0 Vicryl suture. The skin was closed with a 4 0 Vicryl subcuticular stitch.  The patient tolerated the procedure well and there were no complications. Instrument sponge and needle count was correct at the end of the case. The patient was taken to recovery room in stable condition.  Ok to stick fistula except area of fistula  immediately  Ruta Hinds, MD [facility] [place]: [phone] Pager: [phone]

## 2016-08-20 NOTE — Anesthesia Preprocedure Evaluation (Signed)
Anesthesia Evaluation  Patient identified by MRN, date of birth, ID band Patient awake    Reviewed: Allergy & Precautions, NPO status , Patient's Chart, lab work & pertinent test results  Airway Mallampati: II  TM Distance: >3 FB Neck ROM: Full    Dental no notable dental hx. (+) Caps   Pulmonary neg pulmonary ROS,    Pulmonary exam normal breath sounds clear to auscultation       Cardiovascular negative cardio ROS Normal cardiovascular exam Rhythm:Regular Rate:Normal     Neuro/Psych negative neurological ROS  negative psych ROS   GI/Hepatic Neg liver ROS, GERD  Medicated and Controlled,  Endo/Other  negative endocrine ROS  Renal/GU ESRF and DialysisRenal disease  negative genitourinary   Musculoskeletal negative musculoskeletal ROS (+)   Abdominal   Peds negative pediatric ROS (+)  Hematology negative hematology ROS (+)   Anesthesia Other Findings   Reproductive/Obstetrics negative OB ROS                            Anesthesia Physical Anesthesia Plan  ASA: II  Anesthesia Plan: MAC   Post-op Pain Management:    Induction: Intravenous  Airway Management Planned: Simple Face Mask  Additional Equipment:   Intra-op Plan:   Post-operative Plan: Extubation in OR  Informed Consent: I have reviewed the patients History and Physical, chart, labs and discussed the procedure including the risks, benefits and alternatives for the proposed anesthesia with the patient or authorized representative who has indicated his/her understanding and acceptance.   Dental advisory given  Plan Discussed with: CRNA  Anesthesia Plan Comments:         Anesthesia Quick Evaluation

## 2016-08-20 NOTE — H&P (Signed)
[facility] SHORT STAY H&P  CC:  Clotted left arm fistula   HPI: 50 y/o female with clotted left arm AV fistula.  Per pt has been declotted several times in the past.  All of these procedures were done elsewhere.  Last procedure here was in 2014/15.  She did not have dialysis today.  She last ate around 730 am.  She states they put some stents in her access before.  Past Medical History:  Diagnosis Date  . Anemia   . Chronic kidney disease    on HD  . GERD (gastroesophageal reflux disease)   . Secondary hyperparathyroidism ([place])     FH:  Non-Contributory  Social HX Social History  Substance Use Topics  . Smoking status: Never Smoker  . Smokeless tobacco: Never Used  . Alcohol use Not on file    Allergies No Known Allergies  Medications Current Facility-Administered Medications  Medication Dose Route Frequency Provider Last Rate Last Dose  . 0.9 %  sodium chloride infusion   Intravenous Continuous Nolon Nations, MD 10 mL/hr at 08/20/16 1031      PHYSICAL EXAM  Vitals:   08/20/16 1015  BP: 131/81  Pulse: 82  Resp: 20  Temp: 98.3 F (36.8 C)    General:  WDWN in NAD HENT: WNL Eyes: Pupils equal Pulmonary: normal non-labored breathing , without Rales, rhonchi,  Wheezing, left chest wall collaterals Cardiac: RRR, Vascular Exam/Pulses:  No flow left arm AVF, no radial pulse Extremities without ischemic changes, no Gangrene , no cellulitis; no open wounds;  Neuro A&O x 3; good sensation; motion in all extremities  Impression: Occluded left arm AVF which has had multiple interventions and procedures in the past now with 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  She wants to think about this for now.  I have emphasized to her that declotting this fistula will most likely be of limited durability but would proceed with  this if she refuses cathteter.  Will get chest xray now to assess if subclavian stents   Ruta Hinds @TODAY @ 12:12 PM

## 2016-08-20 NOTE — Progress Notes (Signed)
Pt had peanut butter crackers and 16oz sprite this AM at 7:30. Surgery time currently posted as 14:05. Dr. Marcell Barlow notified.

## 2016-08-20 NOTE — Anesthesia Postprocedure Evaluation (Signed)
Anesthesia Post Note  Patient: Lisa Oneal  Procedure(s) Performed: Procedure(s) (LRB): THROMBECTOMY OF LEFT  ARTERIOVENTOUS (AV) FISTULA (Left) ANGIOPLASTY LEFT SUBCLAVIAN VEIN (Left)  Patient location during evaluation: PACU Anesthesia Type: MAC Level of consciousness: awake and alert Pain management: pain level controlled Vital Signs Assessment: post-procedure vital signs reviewed and stable Respiratory status: spontaneous breathing, nonlabored ventilation, respiratory function stable and patient connected to nasal cannula oxygen Cardiovascular status: stable and blood pressure returned to baseline Anesthetic complications: no    Last Vitals:  Vitals:   08/20/16 1015 08/20/16 1645  BP: 131/81   Pulse: 82   Resp: 20   Temp: 36.8 C (P) 36.7 C    Last Pain:  Vitals:   08/20/16 1015  TempSrc: Oral                 Montez Hageman

## 2016-08-20 NOTE — Transfer of Care (Signed)
Immediate Anesthesia Transfer of Care Note  Patient: Lisa Oneal  Procedure(s) Performed: Procedure(s): THROMBECTOMY OF LEFT  ARTERIOVENTOUS (AV) FISTULA (Left) ANGIOPLASTY LEFT SUBCLAVIAN VEIN (Left)  Patient Location: PACU  Anesthesia Type:MAC  Level of Consciousness: awake, alert , oriented and patient cooperative  Airway & Oxygen Therapy: Patient Spontanous Breathing and Patient connected to nasal cannula oxygen  Post-op Assessment: Report given to RN and Post -op Vital signs reviewed and stable  Post vital signs: Reviewed and stable  Last Vitals:  Vitals:   08/20/16 1015  BP: 131/81  Pulse: 82  Resp: 20  Temp: 36.8 C    Last Pain:  Vitals:   08/20/16 1015  TempSrc: Oral         Complications: No apparent anesthesia complications

## 2016-08-21 ENCOUNTER — Encounter (HOSPITAL_COMMUNITY): Payer: Self-pay | Admitting: Vascular Surgery

## 2016-08-21 DIAGNOSIS — N2581 Secondary hyperparathyroidism of renal origin: Secondary | ICD-10-CM | POA: Diagnosis not present

## 2016-08-21 DIAGNOSIS — D631 Anemia in chronic kidney disease: Secondary | ICD-10-CM | POA: Diagnosis not present

## 2016-08-21 DIAGNOSIS — E611 Iron deficiency: Secondary | ICD-10-CM | POA: Diagnosis not present

## 2016-08-21 DIAGNOSIS — N186 End stage renal disease: Secondary | ICD-10-CM | POA: Diagnosis not present

## 2016-08-21 DIAGNOSIS — Z992 Dependence on renal dialysis: Secondary | ICD-10-CM | POA: Diagnosis not present

## 2016-08-23 ENCOUNTER — Other Ambulatory Visit: Payer: Self-pay

## 2016-08-23 DIAGNOSIS — Z992 Dependence on renal dialysis: Secondary | ICD-10-CM | POA: Diagnosis not present

## 2016-08-23 DIAGNOSIS — E611 Iron deficiency: Secondary | ICD-10-CM | POA: Diagnosis not present

## 2016-08-23 DIAGNOSIS — N186 End stage renal disease: Secondary | ICD-10-CM | POA: Diagnosis not present

## 2016-08-23 DIAGNOSIS — N2581 Secondary hyperparathyroidism of renal origin: Secondary | ICD-10-CM | POA: Diagnosis not present

## 2016-08-23 DIAGNOSIS — T82510A Breakdown (mechanical) of surgically created arteriovenous fistula, initial encounter: Secondary | ICD-10-CM

## 2016-08-23 DIAGNOSIS — D631 Anemia in chronic kidney disease: Secondary | ICD-10-CM | POA: Diagnosis not present

## 2016-08-25 DIAGNOSIS — N2581 Secondary hyperparathyroidism of renal origin: Secondary | ICD-10-CM | POA: Diagnosis not present

## 2016-08-25 DIAGNOSIS — D631 Anemia in chronic kidney disease: Secondary | ICD-10-CM | POA: Diagnosis not present

## 2016-08-25 DIAGNOSIS — N186 End stage renal disease: Secondary | ICD-10-CM | POA: Diagnosis not present

## 2016-08-25 DIAGNOSIS — Z992 Dependence on renal dialysis: Secondary | ICD-10-CM | POA: Diagnosis not present

## 2016-08-25 DIAGNOSIS — E611 Iron deficiency: Secondary | ICD-10-CM | POA: Diagnosis not present

## 2016-08-26 ENCOUNTER — Inpatient Hospital Stay (HOSPITAL_COMMUNITY): Admission: RE | Admit: 2016-08-26 | Payer: Medicare Other | Source: Ambulatory Visit

## 2016-08-27 DIAGNOSIS — E611 Iron deficiency: Secondary | ICD-10-CM | POA: Diagnosis not present

## 2016-08-27 DIAGNOSIS — N186 End stage renal disease: Secondary | ICD-10-CM | POA: Diagnosis not present

## 2016-08-27 DIAGNOSIS — D631 Anemia in chronic kidney disease: Secondary | ICD-10-CM | POA: Diagnosis not present

## 2016-08-27 DIAGNOSIS — N2581 Secondary hyperparathyroidism of renal origin: Secondary | ICD-10-CM | POA: Diagnosis not present

## 2016-08-27 DIAGNOSIS — Z992 Dependence on renal dialysis: Secondary | ICD-10-CM | POA: Diagnosis not present

## 2016-08-30 DIAGNOSIS — E611 Iron deficiency: Secondary | ICD-10-CM | POA: Diagnosis not present

## 2016-08-30 DIAGNOSIS — N186 End stage renal disease: Secondary | ICD-10-CM | POA: Diagnosis not present

## 2016-08-30 DIAGNOSIS — Z992 Dependence on renal dialysis: Secondary | ICD-10-CM | POA: Diagnosis not present

## 2016-08-30 DIAGNOSIS — N2581 Secondary hyperparathyroidism of renal origin: Secondary | ICD-10-CM | POA: Diagnosis not present

## 2016-08-30 DIAGNOSIS — D631 Anemia in chronic kidney disease: Secondary | ICD-10-CM | POA: Diagnosis not present

## 2016-09-01 DIAGNOSIS — E611 Iron deficiency: Secondary | ICD-10-CM | POA: Diagnosis not present

## 2016-09-01 DIAGNOSIS — N186 End stage renal disease: Secondary | ICD-10-CM | POA: Diagnosis not present

## 2016-09-01 DIAGNOSIS — D631 Anemia in chronic kidney disease: Secondary | ICD-10-CM | POA: Diagnosis not present

## 2016-09-01 DIAGNOSIS — N2581 Secondary hyperparathyroidism of renal origin: Secondary | ICD-10-CM | POA: Diagnosis not present

## 2016-09-01 DIAGNOSIS — Z992 Dependence on renal dialysis: Secondary | ICD-10-CM | POA: Diagnosis not present

## 2016-09-02 ENCOUNTER — Ambulatory Visit: Payer: Medicare Other | Admitting: Vascular Surgery

## 2016-09-03 DIAGNOSIS — Z992 Dependence on renal dialysis: Secondary | ICD-10-CM | POA: Diagnosis not present

## 2016-09-03 DIAGNOSIS — E611 Iron deficiency: Secondary | ICD-10-CM | POA: Diagnosis not present

## 2016-09-03 DIAGNOSIS — N186 End stage renal disease: Secondary | ICD-10-CM | POA: Diagnosis not present

## 2016-09-03 DIAGNOSIS — N2581 Secondary hyperparathyroidism of renal origin: Secondary | ICD-10-CM | POA: Diagnosis not present

## 2016-09-03 DIAGNOSIS — D631 Anemia in chronic kidney disease: Secondary | ICD-10-CM | POA: Diagnosis not present

## 2016-09-06 DIAGNOSIS — N2581 Secondary hyperparathyroidism of renal origin: Secondary | ICD-10-CM | POA: Diagnosis not present

## 2016-09-06 DIAGNOSIS — D631 Anemia in chronic kidney disease: Secondary | ICD-10-CM | POA: Diagnosis not present

## 2016-09-06 DIAGNOSIS — N186 End stage renal disease: Secondary | ICD-10-CM | POA: Diagnosis not present

## 2016-09-06 DIAGNOSIS — E611 Iron deficiency: Secondary | ICD-10-CM | POA: Diagnosis not present

## 2016-09-06 DIAGNOSIS — Z992 Dependence on renal dialysis: Secondary | ICD-10-CM | POA: Diagnosis not present

## 2016-09-08 DIAGNOSIS — N2581 Secondary hyperparathyroidism of renal origin: Secondary | ICD-10-CM | POA: Diagnosis not present

## 2016-09-08 DIAGNOSIS — E611 Iron deficiency: Secondary | ICD-10-CM | POA: Diagnosis not present

## 2016-09-08 DIAGNOSIS — D631 Anemia in chronic kidney disease: Secondary | ICD-10-CM | POA: Diagnosis not present

## 2016-09-08 DIAGNOSIS — N186 End stage renal disease: Secondary | ICD-10-CM | POA: Diagnosis not present

## 2016-09-08 DIAGNOSIS — Z992 Dependence on renal dialysis: Secondary | ICD-10-CM | POA: Diagnosis not present

## 2016-09-09 DIAGNOSIS — I10 Essential (primary) hypertension: Secondary | ICD-10-CM | POA: Diagnosis not present

## 2016-09-09 DIAGNOSIS — Q438 Other specified congenital malformations of intestine: Secondary | ICD-10-CM | POA: Diagnosis not present

## 2016-09-09 DIAGNOSIS — R935 Abnormal findings on diagnostic imaging of other abdominal regions, including retroperitoneum: Secondary | ICD-10-CM | POA: Diagnosis not present

## 2016-09-09 DIAGNOSIS — N186 End stage renal disease: Secondary | ICD-10-CM | POA: Diagnosis not present

## 2016-09-09 DIAGNOSIS — Z1211 Encounter for screening for malignant neoplasm of colon: Secondary | ICD-10-CM | POA: Diagnosis not present

## 2016-09-10 DIAGNOSIS — E611 Iron deficiency: Secondary | ICD-10-CM | POA: Diagnosis not present

## 2016-09-10 DIAGNOSIS — N2581 Secondary hyperparathyroidism of renal origin: Secondary | ICD-10-CM | POA: Diagnosis not present

## 2016-09-10 DIAGNOSIS — N186 End stage renal disease: Secondary | ICD-10-CM | POA: Diagnosis not present

## 2016-09-10 DIAGNOSIS — Z992 Dependence on renal dialysis: Secondary | ICD-10-CM | POA: Diagnosis not present

## 2016-09-10 DIAGNOSIS — D631 Anemia in chronic kidney disease: Secondary | ICD-10-CM | POA: Diagnosis not present

## 2016-09-13 DIAGNOSIS — Z992 Dependence on renal dialysis: Secondary | ICD-10-CM | POA: Diagnosis not present

## 2016-09-13 DIAGNOSIS — N2581 Secondary hyperparathyroidism of renal origin: Secondary | ICD-10-CM | POA: Diagnosis not present

## 2016-09-13 DIAGNOSIS — E611 Iron deficiency: Secondary | ICD-10-CM | POA: Diagnosis not present

## 2016-09-13 DIAGNOSIS — D631 Anemia in chronic kidney disease: Secondary | ICD-10-CM | POA: Diagnosis not present

## 2016-09-13 DIAGNOSIS — N186 End stage renal disease: Secondary | ICD-10-CM | POA: Diagnosis not present

## 2016-09-14 DIAGNOSIS — N186 End stage renal disease: Secondary | ICD-10-CM | POA: Diagnosis not present

## 2016-09-14 DIAGNOSIS — Z992 Dependence on renal dialysis: Secondary | ICD-10-CM | POA: Diagnosis not present

## 2016-09-15 DIAGNOSIS — Z992 Dependence on renal dialysis: Secondary | ICD-10-CM | POA: Diagnosis not present

## 2016-09-15 DIAGNOSIS — D509 Iron deficiency anemia, unspecified: Secondary | ICD-10-CM | POA: Diagnosis not present

## 2016-09-15 DIAGNOSIS — N2581 Secondary hyperparathyroidism of renal origin: Secondary | ICD-10-CM | POA: Diagnosis not present

## 2016-09-15 DIAGNOSIS — N186 End stage renal disease: Secondary | ICD-10-CM | POA: Diagnosis not present

## 2016-09-15 DIAGNOSIS — E611 Iron deficiency: Secondary | ICD-10-CM | POA: Diagnosis not present

## 2016-09-15 DIAGNOSIS — D631 Anemia in chronic kidney disease: Secondary | ICD-10-CM | POA: Diagnosis not present

## 2016-09-17 DIAGNOSIS — N2581 Secondary hyperparathyroidism of renal origin: Secondary | ICD-10-CM | POA: Diagnosis not present

## 2016-09-17 DIAGNOSIS — Z992 Dependence on renal dialysis: Secondary | ICD-10-CM | POA: Diagnosis not present

## 2016-09-17 DIAGNOSIS — N186 End stage renal disease: Secondary | ICD-10-CM | POA: Diagnosis not present

## 2016-09-17 DIAGNOSIS — E611 Iron deficiency: Secondary | ICD-10-CM | POA: Diagnosis not present

## 2016-09-17 DIAGNOSIS — D509 Iron deficiency anemia, unspecified: Secondary | ICD-10-CM | POA: Diagnosis not present

## 2016-09-17 DIAGNOSIS — D631 Anemia in chronic kidney disease: Secondary | ICD-10-CM | POA: Diagnosis not present

## 2016-09-20 DIAGNOSIS — D509 Iron deficiency anemia, unspecified: Secondary | ICD-10-CM | POA: Diagnosis not present

## 2016-09-20 DIAGNOSIS — N186 End stage renal disease: Secondary | ICD-10-CM | POA: Diagnosis not present

## 2016-09-20 DIAGNOSIS — N2581 Secondary hyperparathyroidism of renal origin: Secondary | ICD-10-CM | POA: Diagnosis not present

## 2016-09-20 DIAGNOSIS — D631 Anemia in chronic kidney disease: Secondary | ICD-10-CM | POA: Diagnosis not present

## 2016-09-20 DIAGNOSIS — E611 Iron deficiency: Secondary | ICD-10-CM | POA: Diagnosis not present

## 2016-09-20 DIAGNOSIS — Z992 Dependence on renal dialysis: Secondary | ICD-10-CM | POA: Diagnosis not present

## 2016-09-22 DIAGNOSIS — Z992 Dependence on renal dialysis: Secondary | ICD-10-CM | POA: Diagnosis not present

## 2016-09-22 DIAGNOSIS — N186 End stage renal disease: Secondary | ICD-10-CM | POA: Diagnosis not present

## 2016-09-22 DIAGNOSIS — E611 Iron deficiency: Secondary | ICD-10-CM | POA: Diagnosis not present

## 2016-09-22 DIAGNOSIS — N2581 Secondary hyperparathyroidism of renal origin: Secondary | ICD-10-CM | POA: Diagnosis not present

## 2016-09-22 DIAGNOSIS — D631 Anemia in chronic kidney disease: Secondary | ICD-10-CM | POA: Diagnosis not present

## 2016-09-22 DIAGNOSIS — D509 Iron deficiency anemia, unspecified: Secondary | ICD-10-CM | POA: Diagnosis not present

## 2016-09-24 DIAGNOSIS — Z992 Dependence on renal dialysis: Secondary | ICD-10-CM | POA: Diagnosis not present

## 2016-09-24 DIAGNOSIS — D631 Anemia in chronic kidney disease: Secondary | ICD-10-CM | POA: Diagnosis not present

## 2016-09-24 DIAGNOSIS — N186 End stage renal disease: Secondary | ICD-10-CM | POA: Diagnosis not present

## 2016-09-24 DIAGNOSIS — E611 Iron deficiency: Secondary | ICD-10-CM | POA: Diagnosis not present

## 2016-09-24 DIAGNOSIS — D509 Iron deficiency anemia, unspecified: Secondary | ICD-10-CM | POA: Diagnosis not present

## 2016-09-24 DIAGNOSIS — N2581 Secondary hyperparathyroidism of renal origin: Secondary | ICD-10-CM | POA: Diagnosis not present

## 2016-09-27 DIAGNOSIS — D631 Anemia in chronic kidney disease: Secondary | ICD-10-CM | POA: Diagnosis not present

## 2016-09-27 DIAGNOSIS — Z992 Dependence on renal dialysis: Secondary | ICD-10-CM | POA: Diagnosis not present

## 2016-09-27 DIAGNOSIS — N186 End stage renal disease: Secondary | ICD-10-CM | POA: Diagnosis not present

## 2016-09-27 DIAGNOSIS — D509 Iron deficiency anemia, unspecified: Secondary | ICD-10-CM | POA: Diagnosis not present

## 2016-09-27 DIAGNOSIS — E611 Iron deficiency: Secondary | ICD-10-CM | POA: Diagnosis not present

## 2016-09-27 DIAGNOSIS — N2581 Secondary hyperparathyroidism of renal origin: Secondary | ICD-10-CM | POA: Diagnosis not present

## 2016-09-28 ENCOUNTER — Other Ambulatory Visit: Payer: Self-pay | Admitting: *Deleted

## 2016-09-28 DIAGNOSIS — T82510D Breakdown (mechanical) of surgically created arteriovenous fistula, subsequent encounter: Secondary | ICD-10-CM

## 2016-09-29 ENCOUNTER — Other Ambulatory Visit: Payer: Self-pay | Admitting: Radiology

## 2016-09-29 ENCOUNTER — Other Ambulatory Visit (HOSPITAL_COMMUNITY): Payer: Self-pay | Admitting: Nephrology

## 2016-09-29 DIAGNOSIS — N186 End stage renal disease: Secondary | ICD-10-CM | POA: Diagnosis not present

## 2016-09-29 DIAGNOSIS — N2581 Secondary hyperparathyroidism of renal origin: Secondary | ICD-10-CM | POA: Diagnosis not present

## 2016-09-29 DIAGNOSIS — Z992 Dependence on renal dialysis: Secondary | ICD-10-CM | POA: Diagnosis not present

## 2016-09-29 DIAGNOSIS — D631 Anemia in chronic kidney disease: Secondary | ICD-10-CM | POA: Diagnosis not present

## 2016-09-29 DIAGNOSIS — D509 Iron deficiency anemia, unspecified: Secondary | ICD-10-CM | POA: Diagnosis not present

## 2016-09-29 DIAGNOSIS — T82868A Thrombosis of vascular prosthetic devices, implants and grafts, initial encounter: Secondary | ICD-10-CM

## 2016-09-29 DIAGNOSIS — E611 Iron deficiency: Secondary | ICD-10-CM | POA: Diagnosis not present

## 2016-09-30 ENCOUNTER — Other Ambulatory Visit (HOSPITAL_COMMUNITY): Payer: Self-pay | Admitting: Nephrology

## 2016-09-30 ENCOUNTER — Ambulatory Visit (HOSPITAL_COMMUNITY)
Admission: RE | Admit: 2016-09-30 | Discharge: 2016-09-30 | Disposition: A | Discharge status: Home / Self Care | Payer: Medicare Other | Source: Ambulatory Visit | Attending: Interventional Radiology | Admitting: Interventional Radiology

## 2016-09-30 ENCOUNTER — Encounter (HOSPITAL_COMMUNITY): Payer: Self-pay

## 2016-09-30 DIAGNOSIS — N2581 Secondary hyperparathyroidism of renal origin: Secondary | ICD-10-CM | POA: Insufficient documentation

## 2016-09-30 DIAGNOSIS — T82858A Stenosis of vascular prosthetic devices, implants and grafts, initial encounter: Secondary | ICD-10-CM | POA: Insufficient documentation

## 2016-09-30 DIAGNOSIS — T82856A Stenosis of peripheral vascular stent, initial encounter: Secondary | ICD-10-CM | POA: Insufficient documentation

## 2016-09-30 DIAGNOSIS — N186 End stage renal disease: Secondary | ICD-10-CM | POA: Insufficient documentation

## 2016-09-30 DIAGNOSIS — K219 Gastro-esophageal reflux disease without esophagitis: Secondary | ICD-10-CM | POA: Insufficient documentation

## 2016-09-30 DIAGNOSIS — T82868A Thrombosis of vascular prosthetic devices, implants and grafts, initial encounter: Secondary | ICD-10-CM

## 2016-09-30 DIAGNOSIS — Z79899 Other long term (current) drug therapy: Secondary | ICD-10-CM | POA: Diagnosis not present

## 2016-09-30 DIAGNOSIS — Y831 Surgical operation with implant of artificial internal device as the cause of abnormal reaction of the patient, or of later complication, without mention of misadventure at the time of the procedure: Secondary | ICD-10-CM | POA: Diagnosis not present

## 2016-09-30 DIAGNOSIS — Z9862 Peripheral vascular angioplasty status: Secondary | ICD-10-CM | POA: Diagnosis not present

## 2016-09-30 DIAGNOSIS — Y832 Surgical operation with anastomosis, bypass or graft as the cause of abnormal reaction of the patient, or of later complication, without mention of misadventure at the time of the procedure: Secondary | ICD-10-CM | POA: Insufficient documentation

## 2016-09-30 DIAGNOSIS — Z992 Dependence on renal dialysis: Secondary | ICD-10-CM | POA: Insufficient documentation

## 2016-09-30 DIAGNOSIS — D631 Anemia in chronic kidney disease: Secondary | ICD-10-CM | POA: Insufficient documentation

## 2016-09-30 HISTORY — PX: IR GENERIC HISTORICAL: IMG1180011

## 2016-09-30 LAB — BASIC METABOLIC PANEL
ANION GAP: 17 — AB (ref 5–15)
BUN: 80 mg/dL — ABNORMAL HIGH (ref 6–20)
CHLORIDE: 95 mmol/L — AB (ref 101–111)
CO2: 23 mmol/L (ref 22–32)
CREATININE: 18.56 mg/dL — AB (ref 0.44–1.00)
Calcium: 9.9 mg/dL (ref 8.9–10.3)
GFR calc non Af Amer: 2 mL/min — ABNORMAL LOW (ref >60)
GFR, EST AFRICAN AMERICAN: 2 mL/min — AB (ref >60)
Glucose, Bld: 96 mg/dL (ref 65–99)
Potassium: 5.2 mmol/L — ABNORMAL HIGH (ref 3.5–5.1)
SODIUM: 135 mmol/L (ref 135–145)

## 2016-09-30 LAB — CBC
HCT: 34.1 % — ABNORMAL LOW (ref 36.0–46.0)
HEMOGLOBIN: 11.1 g/dL — AB (ref 12.0–15.0)
MCH: 31.2 pg (ref 26.0–34.0)
MCHC: 32.6 g/dL (ref 30.0–36.0)
MCV: 95.8 fL (ref 78.0–100.0)
PLATELETS: 137 10*3/uL — AB (ref 150–400)
RBC: 3.56 MIL/uL — AB (ref 3.87–5.11)
RDW: 14 % (ref 11.5–15.5)
WBC: 4.3 10*3/uL (ref 4.0–10.5)

## 2016-09-30 LAB — PROTIME-INR
INR: 1.08
PROTHROMBIN TIME: 14.1 s (ref 11.4–15.2)

## 2016-09-30 LAB — APTT: aPTT: 30 seconds (ref 24–36)

## 2016-09-30 MED ORDER — IOPAMIDOL (ISOVUE-300) INJECTION 61%
INTRAVENOUS | Status: AC
  Administered 2016-09-30: 30 mL
  Filled 2016-09-30: qty 100

## 2016-09-30 MED ORDER — FENTANYL CITRATE (PF) 100 MCG/2ML IJ SOLN
INTRAMUSCULAR | Status: AC | PRN
  Administered 2016-09-30 (×2): 25 ug via INTRAVENOUS
  Administered 2016-09-30: 50 ug via INTRAVENOUS

## 2016-09-30 MED ORDER — HEPARIN SODIUM (PORCINE) 1000 UNIT/ML IJ SOLN
INTRAMUSCULAR | Status: AC
  Filled 2016-09-30: qty 1

## 2016-09-30 MED ORDER — MIDAZOLAM HCL 2 MG/2ML IJ SOLN
INTRAMUSCULAR | Status: AC
  Filled 2016-09-30: qty 2

## 2016-09-30 MED ORDER — FENTANYL CITRATE (PF) 100 MCG/2ML IJ SOLN
INTRAMUSCULAR | Status: AC
  Filled 2016-09-30: qty 2

## 2016-09-30 MED ORDER — SODIUM CHLORIDE 0.9 % IV SOLN
INTRAVENOUS | Status: AC | PRN
  Administered 2016-09-30: 10 mL/h via INTRAVENOUS

## 2016-09-30 MED ORDER — LIDOCAINE-EPINEPHRINE (PF) 1 %-1:200000 IJ SOLN
INTRAMUSCULAR | Status: AC
  Filled 2016-09-30: qty 30

## 2016-09-30 MED ORDER — CEFAZOLIN SODIUM-DEXTROSE 2-4 GM/100ML-% IV SOLN
INTRAVENOUS | Status: AC
  Filled 2016-09-30: qty 100

## 2016-09-30 MED ORDER — CEFAZOLIN SODIUM-DEXTROSE 2-4 GM/100ML-% IV SOLN
2.0000 g | INTRAVENOUS | Status: AC
  Administered 2016-09-30: 2 g via INTRAVENOUS

## 2016-09-30 MED ORDER — MIDAZOLAM HCL 2 MG/2ML IJ SOLN
INTRAMUSCULAR | Status: AC | PRN
  Administered 2016-09-30: 1 mg via INTRAVENOUS
  Administered 2016-09-30 (×2): 0.5 mg via INTRAVENOUS

## 2016-09-30 MED ORDER — ALTEPLASE 2 MG IJ SOLR
INTRAMUSCULAR | Status: AC
  Filled 2016-09-30: qty 2

## 2016-09-30 MED ORDER — LIDOCAINE-EPINEPHRINE (PF) 1 %-1:200000 IJ SOLN
INTRAMUSCULAR | Status: AC | PRN
  Administered 2016-09-30: 10 mL

## 2016-09-30 MED ORDER — SODIUM CHLORIDE 0.9 % IV SOLN: INTRAVENOUS | Status: DC

## 2016-09-30 NOTE — Procedures (Signed)
Technically successful fistulogram with angioplasty.  EBL: Minimal  No immediate complications.  Ronny Bacon, MD Pager #: [phone]

## 2016-09-30 NOTE — H&P (Signed)
Chief Complaint: Patient was seen in consultation today for left upper arm fistula evaluation with possible thrombolysis at the request of [place]  Referring Physician(s): Fran Lowes  Supervising Physician: Marybelle Killings  Patient Status: [facility] - Out-pt  History of Present Illness: Lisa Oneal is a 50 y.o. female   Left upper arm dialysis fistula last used yesterday. Did note some clots per pt but was able to dialyze completely. MD requested evaluation and declot.  Last surgical intervention 08/20/16  Thrombectomy with Dr Oneida Alar  Has worked well til yesterday Most recent previous intervention was in IR 02/2013 angioplasty  Scheduled now for LUA dialysis catheter evaluation with possible thrombolysis and possible angioplasty/stent. Possible tunneled dialysis catheter placement  Past Medical History:  Diagnosis Date  . Anemia   . Chronic kidney disease    on HD  . GERD (gastroesophageal reflux disease)   . Secondary hyperparathyroidism [facility])     Past Surgical History:  Procedure Laterality Date  . ANGIOPLASTY Left 08/20/2016   Procedure: ANGIOPLASTY LEFT SUBCLAVIAN VEIN;  Surgeon: Elam Dutch, MD;  Location: [place];  Service: Vascular;  Laterality: Left;  . AV FISTULA PLACEMENT Left 2012  . OOPHORECTOMY Left 1998  . SHUNTOGRAM N/A 02/19/2014   Procedure: Fistulogram;  Surgeon: Serafina Mitchell, MD;  Location: [facility] CATH LAB;  Service: Cardiovascular;  Laterality: N/A;  . THROMBECTOMY AND REVISION OF ARTERIOVENTOUS (AV) GORETEX  GRAFT Left 08/20/2016   Procedure: THROMBECTOMY OF LEFT  ARTERIOVENTOUS (AV) FISTULA;  Surgeon: Elam Dutch, MD;  Location: [place];  Service: Vascular;  Laterality: Left;  . TUBAL LIGATION      Allergies: Patient has no known allergies.  Medications: Prior to Admission medications   Medication Sig Start Date End Date Taking? Authorizing Provider  pantoprazole (PROTONIX) 20 MG tablet Take 20 mg by mouth daily as needed for  heartburn.    Yes Historical Provider, MD  sevelamer carbonate (RENVELA) 800 MG tablet Take 1,600-3,200 mg by mouth See admin instructions. Take 3200 mg by mouth 3 times daily with meals and take 1600 mg by mouth with snacks.   Yes Historical Provider, MD  zolpidem (AMBIEN) 10 MG tablet Take 10 mg by mouth at bedtime as needed for sleep.    Yes Historical Provider, MD  oxyCODONE-acetaminophen (PERCOCET/ROXICET) 5-325 MG tablet Take 1 tablet by mouth every 6 (six) hours as needed. 08/20/16   Ulyses Amor, PA-C     History reviewed. No pertinent family history.  Social History   Social History  . Marital status: Married    Spouse name: N/A  . Number of children: N/A  . Years of education: N/A   Social History Main Topics  . Smoking status: Never Smoker  . Smokeless tobacco: Never Used  . Alcohol use None  . Drug use: No  . Sexual activity: Not Asked   Other Topics Concern  . None   Social History Narrative  . None    Review of Systems: A 12 point ROS discussed and pertinent positives are indicated in the HPI above.  All other systems are negative.  Review of Systems  Constitutional: Negative for activity change, appetite change, fatigue and fever.  Respiratory: Negative for cough and shortness of breath.   Gastrointestinal: Negative for abdominal pain.  Musculoskeletal: Negative for gait problem.  Psychiatric/Behavioral: Negative for behavioral problems and confusion.    Vital Signs: BP 109/87   Pulse 86   Temp 98 F (36.7 C) (Oral)   Resp 16  Ht 5\' 4"  (1.626 m)   Wt 198 lb (89.8 kg)   LMP 02/12/2013   SpO2 100%   BMI 33.99 kg/m   Physical Exam  Constitutional: She is oriented to person, place, and time. She appears well-nourished.  Cardiovascular: Normal rate and regular rhythm.   Pulmonary/Chest: Effort normal and breath sounds normal.  Abdominal: Soft. Bowel sounds are normal.  Musculoskeletal: Normal range of motion.  LUA dialysis fistula + pulses; no  thrill  Neurological: She is alert and oriented to person, place, and time.  Skin: Skin is warm and dry.  Psychiatric: She has a normal mood and affect. Her behavior is normal. Judgment and thought content normal.  Nursing note and vitals reviewed.   Mallampati Score:  MD Evaluation Airway: WNL Heart: WNL Abdomen: WNL Chest/ Lungs: WNL ASA  Classification: 3 Mallampati/Airway Score: One  Imaging: No results found.  Labs:  CBC:  Recent Labs  08/20/16 1029 09/30/16 0651  WBC  --  4.3  HGB 11.9* 11.1*  HCT 35.0* 34.1*  PLT  --  137*    COAGS:  Recent Labs  09/30/16 0651  INR 1.08  APTT 30    BMP:  Recent Labs  08/20/16 1029 09/30/16 0651  NA 136 135  K 5.5* 5.2*  CL  --  95*  CO2  --  23  GLUCOSE 83 96  BUN  --  80*  CALCIUM  --  9.9  CREATININE  --  18.56*  GFRNONAA  --  2*  GFRAA  --  2*    LIVER FUNCTION TESTS: No results for input(s): BILITOT, AST, ALT, ALKPHOS, PROT, ALBUMIN in the last 8760 hours.  TUMOR MARKERS: No results for input(s): AFPTM, CEA, CA199, CHROMGRNA in the last 8760 hours.  Assessment and Plan:  Clotted LUA dialysis fistula Now scheduled for thrombolysis with poss pta/stent. Possible dialysis catheter placement if needed Risks and Benefits discussed with the patient including, but not limited to bleeding, infection, vascular injury, pulmonary embolism, need for tunneled HD catheter placement or even death. All of the patient's questions were answered, patient is agreeable to proceed. Consent signed and in chart.   Thank you for this interesting consult.  I greatly enjoyed meeting Lisa Oneal and look forward to participating in their care.  A copy of this report was sent to the requesting provider on this date.  Electronically Signed: Dameka Younker A 09/30/2016, 8:32 AM   I spent a total of  30 Minutes   in face to face in clinical consultation, greater than 50% of which was counseling/coordinating care for

## 2016-09-30 NOTE — Discharge Instructions (Signed)
Fistulogram, Care After °Introduction °Refer to this sheet in the next few weeks. These instructions provide you with information on caring for yourself after your procedure. Your health care provider may also give you more specific instructions. Your treatment has been planned according to current medical practices, but problems sometimes occur. Call your health care provider if you have any problems or questions after your procedure. °What can I expect after the procedure? °After your procedure, it is typical to have the following: °· A small amount of discomfort in the area where the catheters were placed. °· A small amount of bruising around the fistula. °· Sleepiness and fatigue. °Follow these instructions at home: °· Rest at home for the day following your procedure. °· Do not drive or operate heavy machinery while taking pain medicine. °· Take medicines only as directed by your health care provider. °· Do not take baths, swim, or use a hot tub until your health care provider approves. You may shower 24 hours after the procedure or as directed by your health care provider. °· There are many different ways to close and cover an incision, including stitches, skin glue, and adhesive strips. Follow your health care provider's instructions on: °¨ Incision care. °¨ Bandage (dressing) changes and removal. °¨ Incision closure removal. °· Monitor your dialysis fistula carefully. °Contact a health care provider if: °· You have drainage, redness, swelling, or pain at your catheter site. °· You have a fever. °· You have chills. °Get help right away if: °· You feel weak. °· You have trouble balancing. °· You have trouble moving your arms or legs. °· You have problems with your speech or vision. °· You can no longer feel a vibration or buzz when you put your fingers over your dialysis fistula. °· The limb that was used for the procedure: °¨ Swells. °¨ Is painful. °¨ Is cold. °¨ Is discolored, such as blue or pale  white. °This information is not intended to replace advice given to you by your health care provider. Make sure you discuss any questions you have with your health care provider. °Document Released: 03/18/2014 Document Revised: 04/08/2016 Document Reviewed: 12/21/2013 °© 2017 [employer]vier ° °

## 2016-10-01 DIAGNOSIS — N2581 Secondary hyperparathyroidism of renal origin: Secondary | ICD-10-CM | POA: Diagnosis not present

## 2016-10-01 DIAGNOSIS — D509 Iron deficiency anemia, unspecified: Secondary | ICD-10-CM | POA: Diagnosis not present

## 2016-10-01 DIAGNOSIS — Z992 Dependence on renal dialysis: Secondary | ICD-10-CM | POA: Diagnosis not present

## 2016-10-01 DIAGNOSIS — E611 Iron deficiency: Secondary | ICD-10-CM | POA: Diagnosis not present

## 2016-10-01 DIAGNOSIS — D631 Anemia in chronic kidney disease: Secondary | ICD-10-CM | POA: Diagnosis not present

## 2016-10-01 DIAGNOSIS — N186 End stage renal disease: Secondary | ICD-10-CM | POA: Diagnosis not present

## 2016-10-04 DIAGNOSIS — N186 End stage renal disease: Secondary | ICD-10-CM | POA: Diagnosis not present

## 2016-10-04 DIAGNOSIS — D509 Iron deficiency anemia, unspecified: Secondary | ICD-10-CM | POA: Diagnosis not present

## 2016-10-04 DIAGNOSIS — D631 Anemia in chronic kidney disease: Secondary | ICD-10-CM | POA: Diagnosis not present

## 2016-10-04 DIAGNOSIS — N2581 Secondary hyperparathyroidism of renal origin: Secondary | ICD-10-CM | POA: Diagnosis not present

## 2016-10-04 DIAGNOSIS — E611 Iron deficiency: Secondary | ICD-10-CM | POA: Diagnosis not present

## 2016-10-04 DIAGNOSIS — Z992 Dependence on renal dialysis: Secondary | ICD-10-CM | POA: Diagnosis not present

## 2016-10-06 DIAGNOSIS — D631 Anemia in chronic kidney disease: Secondary | ICD-10-CM | POA: Diagnosis not present

## 2016-10-06 DIAGNOSIS — N2581 Secondary hyperparathyroidism of renal origin: Secondary | ICD-10-CM | POA: Diagnosis not present

## 2016-10-06 DIAGNOSIS — E611 Iron deficiency: Secondary | ICD-10-CM | POA: Diagnosis not present

## 2016-10-06 DIAGNOSIS — Z992 Dependence on renal dialysis: Secondary | ICD-10-CM | POA: Diagnosis not present

## 2016-10-06 DIAGNOSIS — D509 Iron deficiency anemia, unspecified: Secondary | ICD-10-CM | POA: Diagnosis not present

## 2016-10-06 DIAGNOSIS — N186 End stage renal disease: Secondary | ICD-10-CM | POA: Diagnosis not present

## 2016-10-08 DIAGNOSIS — D631 Anemia in chronic kidney disease: Secondary | ICD-10-CM | POA: Diagnosis not present

## 2016-10-08 DIAGNOSIS — D509 Iron deficiency anemia, unspecified: Secondary | ICD-10-CM | POA: Diagnosis not present

## 2016-10-08 DIAGNOSIS — Z992 Dependence on renal dialysis: Secondary | ICD-10-CM | POA: Diagnosis not present

## 2016-10-08 DIAGNOSIS — N2581 Secondary hyperparathyroidism of renal origin: Secondary | ICD-10-CM | POA: Diagnosis not present

## 2016-10-08 DIAGNOSIS — E611 Iron deficiency: Secondary | ICD-10-CM | POA: Diagnosis not present

## 2016-10-08 DIAGNOSIS — N186 End stage renal disease: Secondary | ICD-10-CM | POA: Diagnosis not present

## 2016-10-11 DIAGNOSIS — Z992 Dependence on renal dialysis: Secondary | ICD-10-CM | POA: Diagnosis not present

## 2016-10-11 DIAGNOSIS — D631 Anemia in chronic kidney disease: Secondary | ICD-10-CM | POA: Diagnosis not present

## 2016-10-11 DIAGNOSIS — N2581 Secondary hyperparathyroidism of renal origin: Secondary | ICD-10-CM | POA: Diagnosis not present

## 2016-10-11 DIAGNOSIS — E611 Iron deficiency: Secondary | ICD-10-CM | POA: Diagnosis not present

## 2016-10-11 DIAGNOSIS — N186 End stage renal disease: Secondary | ICD-10-CM | POA: Diagnosis not present

## 2016-10-11 DIAGNOSIS — D509 Iron deficiency anemia, unspecified: Secondary | ICD-10-CM | POA: Diagnosis not present

## 2016-10-13 DIAGNOSIS — D631 Anemia in chronic kidney disease: Secondary | ICD-10-CM | POA: Diagnosis not present

## 2016-10-13 DIAGNOSIS — E611 Iron deficiency: Secondary | ICD-10-CM | POA: Diagnosis not present

## 2016-10-13 DIAGNOSIS — Z992 Dependence on renal dialysis: Secondary | ICD-10-CM | POA: Diagnosis not present

## 2016-10-13 DIAGNOSIS — D509 Iron deficiency anemia, unspecified: Secondary | ICD-10-CM | POA: Diagnosis not present

## 2016-10-13 DIAGNOSIS — N2581 Secondary hyperparathyroidism of renal origin: Secondary | ICD-10-CM | POA: Diagnosis not present

## 2016-10-13 DIAGNOSIS — N186 End stage renal disease: Secondary | ICD-10-CM | POA: Diagnosis not present

## 2016-10-14 ENCOUNTER — Encounter (HOSPITAL_COMMUNITY): Payer: Medicare Other

## 2016-10-15 DIAGNOSIS — D631 Anemia in chronic kidney disease: Secondary | ICD-10-CM | POA: Diagnosis not present

## 2016-10-15 DIAGNOSIS — N2581 Secondary hyperparathyroidism of renal origin: Secondary | ICD-10-CM | POA: Diagnosis not present

## 2016-10-15 DIAGNOSIS — Z992 Dependence on renal dialysis: Secondary | ICD-10-CM | POA: Diagnosis not present

## 2016-10-15 DIAGNOSIS — N186 End stage renal disease: Secondary | ICD-10-CM | POA: Diagnosis not present

## 2016-10-18 DIAGNOSIS — D631 Anemia in chronic kidney disease: Secondary | ICD-10-CM | POA: Diagnosis not present

## 2016-10-18 DIAGNOSIS — N2581 Secondary hyperparathyroidism of renal origin: Secondary | ICD-10-CM | POA: Diagnosis not present

## 2016-10-18 DIAGNOSIS — N186 End stage renal disease: Secondary | ICD-10-CM | POA: Diagnosis not present

## 2016-10-18 DIAGNOSIS — Z992 Dependence on renal dialysis: Secondary | ICD-10-CM | POA: Diagnosis not present

## 2016-10-18 DIAGNOSIS — E611 Iron deficiency: Secondary | ICD-10-CM | POA: Diagnosis not present

## 2016-10-18 DIAGNOSIS — D509 Iron deficiency anemia, unspecified: Secondary | ICD-10-CM | POA: Diagnosis not present

## 2016-10-19 ENCOUNTER — Ambulatory Visit: Payer: Medicare Other | Admitting: Vascular Surgery

## 2016-10-19 DIAGNOSIS — Z7189 Other specified counseling: Secondary | ICD-10-CM | POA: Diagnosis not present

## 2016-10-19 DIAGNOSIS — Z0000 Encounter for general adult medical examination without abnormal findings: Secondary | ICD-10-CM | POA: Diagnosis not present

## 2016-10-19 DIAGNOSIS — Z299 Encounter for prophylactic measures, unspecified: Secondary | ICD-10-CM | POA: Diagnosis not present

## 2016-10-19 DIAGNOSIS — Z1389 Encounter for screening for other disorder: Secondary | ICD-10-CM | POA: Diagnosis not present

## 2016-10-19 DIAGNOSIS — Z6835 Body mass index (BMI) 35.0-35.9, adult: Secondary | ICD-10-CM | POA: Diagnosis not present

## 2016-10-20 DIAGNOSIS — E611 Iron deficiency: Secondary | ICD-10-CM | POA: Diagnosis not present

## 2016-10-20 DIAGNOSIS — N186 End stage renal disease: Secondary | ICD-10-CM | POA: Diagnosis not present

## 2016-10-20 DIAGNOSIS — D631 Anemia in chronic kidney disease: Secondary | ICD-10-CM | POA: Diagnosis not present

## 2016-10-20 DIAGNOSIS — N2581 Secondary hyperparathyroidism of renal origin: Secondary | ICD-10-CM | POA: Diagnosis not present

## 2016-10-20 DIAGNOSIS — D509 Iron deficiency anemia, unspecified: Secondary | ICD-10-CM | POA: Diagnosis not present

## 2016-10-20 DIAGNOSIS — Z992 Dependence on renal dialysis: Secondary | ICD-10-CM | POA: Diagnosis not present

## 2016-10-22 DIAGNOSIS — N186 End stage renal disease: Secondary | ICD-10-CM | POA: Diagnosis not present

## 2016-10-22 DIAGNOSIS — D631 Anemia in chronic kidney disease: Secondary | ICD-10-CM | POA: Diagnosis not present

## 2016-10-22 DIAGNOSIS — D509 Iron deficiency anemia, unspecified: Secondary | ICD-10-CM | POA: Diagnosis not present

## 2016-10-22 DIAGNOSIS — N2581 Secondary hyperparathyroidism of renal origin: Secondary | ICD-10-CM | POA: Diagnosis not present

## 2016-10-22 DIAGNOSIS — E611 Iron deficiency: Secondary | ICD-10-CM | POA: Diagnosis not present

## 2016-10-22 DIAGNOSIS — Z992 Dependence on renal dialysis: Secondary | ICD-10-CM | POA: Diagnosis not present

## 2016-10-25 DIAGNOSIS — N186 End stage renal disease: Secondary | ICD-10-CM | POA: Diagnosis not present

## 2016-10-25 DIAGNOSIS — E611 Iron deficiency: Secondary | ICD-10-CM | POA: Diagnosis not present

## 2016-10-25 DIAGNOSIS — D631 Anemia in chronic kidney disease: Secondary | ICD-10-CM | POA: Diagnosis not present

## 2016-10-25 DIAGNOSIS — D509 Iron deficiency anemia, unspecified: Secondary | ICD-10-CM | POA: Diagnosis not present

## 2016-10-25 DIAGNOSIS — N2581 Secondary hyperparathyroidism of renal origin: Secondary | ICD-10-CM | POA: Diagnosis not present

## 2016-10-25 DIAGNOSIS — Z992 Dependence on renal dialysis: Secondary | ICD-10-CM | POA: Diagnosis not present

## 2016-10-27 DIAGNOSIS — N186 End stage renal disease: Secondary | ICD-10-CM | POA: Diagnosis not present

## 2016-10-27 DIAGNOSIS — D509 Iron deficiency anemia, unspecified: Secondary | ICD-10-CM | POA: Diagnosis not present

## 2016-10-27 DIAGNOSIS — Z992 Dependence on renal dialysis: Secondary | ICD-10-CM | POA: Diagnosis not present

## 2016-10-27 DIAGNOSIS — N2581 Secondary hyperparathyroidism of renal origin: Secondary | ICD-10-CM | POA: Diagnosis not present

## 2016-10-27 DIAGNOSIS — D631 Anemia in chronic kidney disease: Secondary | ICD-10-CM | POA: Diagnosis not present

## 2016-10-27 DIAGNOSIS — E611 Iron deficiency: Secondary | ICD-10-CM | POA: Diagnosis not present

## 2016-11-01 DIAGNOSIS — N2581 Secondary hyperparathyroidism of renal origin: Secondary | ICD-10-CM | POA: Diagnosis not present

## 2016-11-01 DIAGNOSIS — E611 Iron deficiency: Secondary | ICD-10-CM | POA: Diagnosis not present

## 2016-11-01 DIAGNOSIS — D631 Anemia in chronic kidney disease: Secondary | ICD-10-CM | POA: Diagnosis not present

## 2016-11-01 DIAGNOSIS — D509 Iron deficiency anemia, unspecified: Secondary | ICD-10-CM | POA: Diagnosis not present

## 2016-11-01 DIAGNOSIS — Z992 Dependence on renal dialysis: Secondary | ICD-10-CM | POA: Diagnosis not present

## 2016-11-01 DIAGNOSIS — N186 End stage renal disease: Secondary | ICD-10-CM | POA: Diagnosis not present

## 2016-11-03 DIAGNOSIS — E611 Iron deficiency: Secondary | ICD-10-CM | POA: Diagnosis not present

## 2016-11-03 DIAGNOSIS — D631 Anemia in chronic kidney disease: Secondary | ICD-10-CM | POA: Diagnosis not present

## 2016-11-03 DIAGNOSIS — D509 Iron deficiency anemia, unspecified: Secondary | ICD-10-CM | POA: Diagnosis not present

## 2016-11-03 DIAGNOSIS — Z992 Dependence on renal dialysis: Secondary | ICD-10-CM | POA: Diagnosis not present

## 2016-11-03 DIAGNOSIS — N2581 Secondary hyperparathyroidism of renal origin: Secondary | ICD-10-CM | POA: Diagnosis not present

## 2016-11-03 DIAGNOSIS — N186 End stage renal disease: Secondary | ICD-10-CM | POA: Diagnosis not present

## 2016-11-05 DIAGNOSIS — D631 Anemia in chronic kidney disease: Secondary | ICD-10-CM | POA: Diagnosis not present

## 2016-11-05 DIAGNOSIS — N186 End stage renal disease: Secondary | ICD-10-CM | POA: Diagnosis not present

## 2016-11-05 DIAGNOSIS — N2581 Secondary hyperparathyroidism of renal origin: Secondary | ICD-10-CM | POA: Diagnosis not present

## 2016-11-05 DIAGNOSIS — E611 Iron deficiency: Secondary | ICD-10-CM | POA: Diagnosis not present

## 2016-11-05 DIAGNOSIS — Z992 Dependence on renal dialysis: Secondary | ICD-10-CM | POA: Diagnosis not present

## 2016-11-05 DIAGNOSIS — D509 Iron deficiency anemia, unspecified: Secondary | ICD-10-CM | POA: Diagnosis not present

## 2016-11-06 DIAGNOSIS — N2581 Secondary hyperparathyroidism of renal origin: Secondary | ICD-10-CM | POA: Diagnosis not present

## 2016-11-06 DIAGNOSIS — D509 Iron deficiency anemia, unspecified: Secondary | ICD-10-CM | POA: Diagnosis not present

## 2016-11-06 DIAGNOSIS — E611 Iron deficiency: Secondary | ICD-10-CM | POA: Diagnosis not present

## 2016-11-06 DIAGNOSIS — D631 Anemia in chronic kidney disease: Secondary | ICD-10-CM | POA: Diagnosis not present

## 2016-11-06 DIAGNOSIS — N186 End stage renal disease: Secondary | ICD-10-CM | POA: Diagnosis not present

## 2016-11-06 DIAGNOSIS — Z992 Dependence on renal dialysis: Secondary | ICD-10-CM | POA: Diagnosis not present

## 2016-11-10 DIAGNOSIS — E611 Iron deficiency: Secondary | ICD-10-CM | POA: Diagnosis not present

## 2016-11-10 DIAGNOSIS — D631 Anemia in chronic kidney disease: Secondary | ICD-10-CM | POA: Diagnosis not present

## 2016-11-10 DIAGNOSIS — N186 End stage renal disease: Secondary | ICD-10-CM | POA: Diagnosis not present

## 2016-11-10 DIAGNOSIS — N2581 Secondary hyperparathyroidism of renal origin: Secondary | ICD-10-CM | POA: Diagnosis not present

## 2016-11-10 DIAGNOSIS — Z992 Dependence on renal dialysis: Secondary | ICD-10-CM | POA: Diagnosis not present

## 2016-11-10 DIAGNOSIS — D509 Iron deficiency anemia, unspecified: Secondary | ICD-10-CM | POA: Diagnosis not present

## 2016-11-12 DIAGNOSIS — N2581 Secondary hyperparathyroidism of renal origin: Secondary | ICD-10-CM | POA: Diagnosis not present

## 2016-11-12 DIAGNOSIS — D509 Iron deficiency anemia, unspecified: Secondary | ICD-10-CM | POA: Diagnosis not present

## 2016-11-12 DIAGNOSIS — N186 End stage renal disease: Secondary | ICD-10-CM | POA: Diagnosis not present

## 2016-11-12 DIAGNOSIS — Z992 Dependence on renal dialysis: Secondary | ICD-10-CM | POA: Diagnosis not present

## 2016-11-12 DIAGNOSIS — E611 Iron deficiency: Secondary | ICD-10-CM | POA: Diagnosis not present

## 2016-11-12 DIAGNOSIS — D631 Anemia in chronic kidney disease: Secondary | ICD-10-CM | POA: Diagnosis not present

## 2016-11-14 DIAGNOSIS — N2581 Secondary hyperparathyroidism of renal origin: Secondary | ICD-10-CM | POA: Diagnosis not present

## 2016-11-14 DIAGNOSIS — D631 Anemia in chronic kidney disease: Secondary | ICD-10-CM | POA: Diagnosis not present

## 2016-11-14 DIAGNOSIS — E611 Iron deficiency: Secondary | ICD-10-CM | POA: Diagnosis not present

## 2016-11-14 DIAGNOSIS — D509 Iron deficiency anemia, unspecified: Secondary | ICD-10-CM | POA: Diagnosis not present

## 2016-11-14 DIAGNOSIS — Z992 Dependence on renal dialysis: Secondary | ICD-10-CM | POA: Diagnosis not present

## 2016-11-14 DIAGNOSIS — N186 End stage renal disease: Secondary | ICD-10-CM | POA: Diagnosis not present

## 2016-11-17 DIAGNOSIS — N186 End stage renal disease: Secondary | ICD-10-CM | POA: Diagnosis not present

## 2016-11-17 DIAGNOSIS — D631 Anemia in chronic kidney disease: Secondary | ICD-10-CM | POA: Diagnosis not present

## 2016-11-17 DIAGNOSIS — N2581 Secondary hyperparathyroidism of renal origin: Secondary | ICD-10-CM | POA: Diagnosis not present

## 2016-11-17 DIAGNOSIS — E611 Iron deficiency: Secondary | ICD-10-CM | POA: Diagnosis not present

## 2016-11-17 DIAGNOSIS — Z992 Dependence on renal dialysis: Secondary | ICD-10-CM | POA: Diagnosis not present

## 2016-11-19 DIAGNOSIS — N186 End stage renal disease: Secondary | ICD-10-CM | POA: Diagnosis not present

## 2016-11-19 DIAGNOSIS — D631 Anemia in chronic kidney disease: Secondary | ICD-10-CM | POA: Diagnosis not present

## 2016-11-19 DIAGNOSIS — E611 Iron deficiency: Secondary | ICD-10-CM | POA: Diagnosis not present

## 2016-11-19 DIAGNOSIS — Z992 Dependence on renal dialysis: Secondary | ICD-10-CM | POA: Diagnosis not present

## 2016-11-19 DIAGNOSIS — N2581 Secondary hyperparathyroidism of renal origin: Secondary | ICD-10-CM | POA: Diagnosis not present

## 2016-11-22 DIAGNOSIS — N186 End stage renal disease: Secondary | ICD-10-CM | POA: Diagnosis not present

## 2016-11-22 DIAGNOSIS — E611 Iron deficiency: Secondary | ICD-10-CM | POA: Diagnosis not present

## 2016-11-22 DIAGNOSIS — Z992 Dependence on renal dialysis: Secondary | ICD-10-CM | POA: Diagnosis not present

## 2016-11-22 DIAGNOSIS — D631 Anemia in chronic kidney disease: Secondary | ICD-10-CM | POA: Diagnosis not present

## 2016-11-22 DIAGNOSIS — N2581 Secondary hyperparathyroidism of renal origin: Secondary | ICD-10-CM | POA: Diagnosis not present

## 2016-11-24 DIAGNOSIS — N186 End stage renal disease: Secondary | ICD-10-CM | POA: Diagnosis not present

## 2016-11-24 DIAGNOSIS — D631 Anemia in chronic kidney disease: Secondary | ICD-10-CM | POA: Diagnosis not present

## 2016-11-24 DIAGNOSIS — E611 Iron deficiency: Secondary | ICD-10-CM | POA: Diagnosis not present

## 2016-11-24 DIAGNOSIS — Z992 Dependence on renal dialysis: Secondary | ICD-10-CM | POA: Diagnosis not present

## 2016-11-24 DIAGNOSIS — N2581 Secondary hyperparathyroidism of renal origin: Secondary | ICD-10-CM | POA: Diagnosis not present

## 2016-11-26 DIAGNOSIS — N186 End stage renal disease: Secondary | ICD-10-CM | POA: Diagnosis not present

## 2016-11-26 DIAGNOSIS — E611 Iron deficiency: Secondary | ICD-10-CM | POA: Diagnosis not present

## 2016-11-26 DIAGNOSIS — D631 Anemia in chronic kidney disease: Secondary | ICD-10-CM | POA: Diagnosis not present

## 2016-11-26 DIAGNOSIS — N2581 Secondary hyperparathyroidism of renal origin: Secondary | ICD-10-CM | POA: Diagnosis not present

## 2016-11-26 DIAGNOSIS — Z992 Dependence on renal dialysis: Secondary | ICD-10-CM | POA: Diagnosis not present

## 2016-11-29 DIAGNOSIS — D631 Anemia in chronic kidney disease: Secondary | ICD-10-CM | POA: Diagnosis not present

## 2016-11-29 DIAGNOSIS — N2581 Secondary hyperparathyroidism of renal origin: Secondary | ICD-10-CM | POA: Diagnosis not present

## 2016-11-29 DIAGNOSIS — N186 End stage renal disease: Secondary | ICD-10-CM | POA: Diagnosis not present

## 2016-11-29 DIAGNOSIS — E611 Iron deficiency: Secondary | ICD-10-CM | POA: Diagnosis not present

## 2016-11-29 DIAGNOSIS — Z992 Dependence on renal dialysis: Secondary | ICD-10-CM | POA: Diagnosis not present

## 2016-12-01 DIAGNOSIS — N2581 Secondary hyperparathyroidism of renal origin: Secondary | ICD-10-CM | POA: Diagnosis not present

## 2016-12-01 DIAGNOSIS — N186 End stage renal disease: Secondary | ICD-10-CM | POA: Diagnosis not present

## 2016-12-01 DIAGNOSIS — E611 Iron deficiency: Secondary | ICD-10-CM | POA: Diagnosis not present

## 2016-12-01 DIAGNOSIS — Z992 Dependence on renal dialysis: Secondary | ICD-10-CM | POA: Diagnosis not present

## 2016-12-01 DIAGNOSIS — D631 Anemia in chronic kidney disease: Secondary | ICD-10-CM | POA: Diagnosis not present

## 2016-12-03 DIAGNOSIS — N2581 Secondary hyperparathyroidism of renal origin: Secondary | ICD-10-CM | POA: Diagnosis not present

## 2016-12-03 DIAGNOSIS — Z992 Dependence on renal dialysis: Secondary | ICD-10-CM | POA: Diagnosis not present

## 2016-12-03 DIAGNOSIS — N186 End stage renal disease: Secondary | ICD-10-CM | POA: Diagnosis not present

## 2016-12-03 DIAGNOSIS — D631 Anemia in chronic kidney disease: Secondary | ICD-10-CM | POA: Diagnosis not present

## 2016-12-03 DIAGNOSIS — E611 Iron deficiency: Secondary | ICD-10-CM | POA: Diagnosis not present

## 2016-12-06 DIAGNOSIS — N2581 Secondary hyperparathyroidism of renal origin: Secondary | ICD-10-CM | POA: Diagnosis not present

## 2016-12-06 DIAGNOSIS — Z992 Dependence on renal dialysis: Secondary | ICD-10-CM | POA: Diagnosis not present

## 2016-12-06 DIAGNOSIS — D631 Anemia in chronic kidney disease: Secondary | ICD-10-CM | POA: Diagnosis not present

## 2016-12-06 DIAGNOSIS — N186 End stage renal disease: Secondary | ICD-10-CM | POA: Diagnosis not present

## 2016-12-06 DIAGNOSIS — E611 Iron deficiency: Secondary | ICD-10-CM | POA: Diagnosis not present

## 2016-12-08 DIAGNOSIS — N186 End stage renal disease: Secondary | ICD-10-CM | POA: Diagnosis not present

## 2016-12-08 DIAGNOSIS — N2581 Secondary hyperparathyroidism of renal origin: Secondary | ICD-10-CM | POA: Diagnosis not present

## 2016-12-08 DIAGNOSIS — Z992 Dependence on renal dialysis: Secondary | ICD-10-CM | POA: Diagnosis not present

## 2016-12-08 DIAGNOSIS — E611 Iron deficiency: Secondary | ICD-10-CM | POA: Diagnosis not present

## 2016-12-08 DIAGNOSIS — D631 Anemia in chronic kidney disease: Secondary | ICD-10-CM | POA: Diagnosis not present

## 2016-12-10 DIAGNOSIS — N2581 Secondary hyperparathyroidism of renal origin: Secondary | ICD-10-CM | POA: Diagnosis not present

## 2016-12-10 DIAGNOSIS — N186 End stage renal disease: Secondary | ICD-10-CM | POA: Diagnosis not present

## 2016-12-10 DIAGNOSIS — D631 Anemia in chronic kidney disease: Secondary | ICD-10-CM | POA: Diagnosis not present

## 2016-12-10 DIAGNOSIS — E611 Iron deficiency: Secondary | ICD-10-CM | POA: Diagnosis not present

## 2016-12-10 DIAGNOSIS — Z992 Dependence on renal dialysis: Secondary | ICD-10-CM | POA: Diagnosis not present

## 2016-12-13 DIAGNOSIS — E611 Iron deficiency: Secondary | ICD-10-CM | POA: Diagnosis not present

## 2016-12-13 DIAGNOSIS — Z992 Dependence on renal dialysis: Secondary | ICD-10-CM | POA: Diagnosis not present

## 2016-12-13 DIAGNOSIS — D631 Anemia in chronic kidney disease: Secondary | ICD-10-CM | POA: Diagnosis not present

## 2016-12-13 DIAGNOSIS — N2581 Secondary hyperparathyroidism of renal origin: Secondary | ICD-10-CM | POA: Diagnosis not present

## 2016-12-13 DIAGNOSIS — N186 End stage renal disease: Secondary | ICD-10-CM | POA: Diagnosis not present

## 2016-12-15 DIAGNOSIS — D631 Anemia in chronic kidney disease: Secondary | ICD-10-CM | POA: Diagnosis not present

## 2016-12-15 DIAGNOSIS — N186 End stage renal disease: Secondary | ICD-10-CM | POA: Diagnosis not present

## 2016-12-15 DIAGNOSIS — Z992 Dependence on renal dialysis: Secondary | ICD-10-CM | POA: Diagnosis not present

## 2016-12-15 DIAGNOSIS — N2581 Secondary hyperparathyroidism of renal origin: Secondary | ICD-10-CM | POA: Diagnosis not present

## 2016-12-15 DIAGNOSIS — E611 Iron deficiency: Secondary | ICD-10-CM | POA: Diagnosis not present

## 2016-12-17 DIAGNOSIS — D631 Anemia in chronic kidney disease: Secondary | ICD-10-CM | POA: Diagnosis not present

## 2016-12-17 DIAGNOSIS — Z992 Dependence on renal dialysis: Secondary | ICD-10-CM | POA: Diagnosis not present

## 2016-12-17 DIAGNOSIS — E611 Iron deficiency: Secondary | ICD-10-CM | POA: Diagnosis not present

## 2016-12-17 DIAGNOSIS — N186 End stage renal disease: Secondary | ICD-10-CM | POA: Diagnosis not present

## 2016-12-17 DIAGNOSIS — N2581 Secondary hyperparathyroidism of renal origin: Secondary | ICD-10-CM | POA: Diagnosis not present

## 2016-12-20 DIAGNOSIS — Z992 Dependence on renal dialysis: Secondary | ICD-10-CM | POA: Diagnosis not present

## 2016-12-20 DIAGNOSIS — N2581 Secondary hyperparathyroidism of renal origin: Secondary | ICD-10-CM | POA: Diagnosis not present

## 2016-12-20 DIAGNOSIS — E611 Iron deficiency: Secondary | ICD-10-CM | POA: Diagnosis not present

## 2016-12-20 DIAGNOSIS — D631 Anemia in chronic kidney disease: Secondary | ICD-10-CM | POA: Diagnosis not present

## 2016-12-20 DIAGNOSIS — N186 End stage renal disease: Secondary | ICD-10-CM | POA: Diagnosis not present

## 2016-12-22 DIAGNOSIS — N2581 Secondary hyperparathyroidism of renal origin: Secondary | ICD-10-CM | POA: Diagnosis not present

## 2016-12-22 DIAGNOSIS — E611 Iron deficiency: Secondary | ICD-10-CM | POA: Diagnosis not present

## 2016-12-22 DIAGNOSIS — D631 Anemia in chronic kidney disease: Secondary | ICD-10-CM | POA: Diagnosis not present

## 2016-12-22 DIAGNOSIS — Z992 Dependence on renal dialysis: Secondary | ICD-10-CM | POA: Diagnosis not present

## 2016-12-22 DIAGNOSIS — N186 End stage renal disease: Secondary | ICD-10-CM | POA: Diagnosis not present

## 2016-12-24 DIAGNOSIS — E611 Iron deficiency: Secondary | ICD-10-CM | POA: Diagnosis not present

## 2016-12-24 DIAGNOSIS — Z992 Dependence on renal dialysis: Secondary | ICD-10-CM | POA: Diagnosis not present

## 2016-12-24 DIAGNOSIS — N186 End stage renal disease: Secondary | ICD-10-CM | POA: Diagnosis not present

## 2016-12-24 DIAGNOSIS — D631 Anemia in chronic kidney disease: Secondary | ICD-10-CM | POA: Diagnosis not present

## 2016-12-24 DIAGNOSIS — N2581 Secondary hyperparathyroidism of renal origin: Secondary | ICD-10-CM | POA: Diagnosis not present

## 2016-12-27 DIAGNOSIS — Z992 Dependence on renal dialysis: Secondary | ICD-10-CM | POA: Diagnosis not present

## 2016-12-27 DIAGNOSIS — N186 End stage renal disease: Secondary | ICD-10-CM | POA: Diagnosis not present

## 2016-12-27 DIAGNOSIS — D631 Anemia in chronic kidney disease: Secondary | ICD-10-CM | POA: Diagnosis not present

## 2016-12-27 DIAGNOSIS — N2581 Secondary hyperparathyroidism of renal origin: Secondary | ICD-10-CM | POA: Diagnosis not present

## 2016-12-27 DIAGNOSIS — E611 Iron deficiency: Secondary | ICD-10-CM | POA: Diagnosis not present

## 2016-12-29 DIAGNOSIS — E611 Iron deficiency: Secondary | ICD-10-CM | POA: Diagnosis not present

## 2016-12-29 DIAGNOSIS — Z992 Dependence on renal dialysis: Secondary | ICD-10-CM | POA: Diagnosis not present

## 2016-12-29 DIAGNOSIS — N2581 Secondary hyperparathyroidism of renal origin: Secondary | ICD-10-CM | POA: Diagnosis not present

## 2016-12-29 DIAGNOSIS — N186 End stage renal disease: Secondary | ICD-10-CM | POA: Diagnosis not present

## 2016-12-29 DIAGNOSIS — D631 Anemia in chronic kidney disease: Secondary | ICD-10-CM | POA: Diagnosis not present

## 2016-12-31 DIAGNOSIS — E611 Iron deficiency: Secondary | ICD-10-CM | POA: Diagnosis not present

## 2016-12-31 DIAGNOSIS — N2581 Secondary hyperparathyroidism of renal origin: Secondary | ICD-10-CM | POA: Diagnosis not present

## 2016-12-31 DIAGNOSIS — D631 Anemia in chronic kidney disease: Secondary | ICD-10-CM | POA: Diagnosis not present

## 2016-12-31 DIAGNOSIS — N186 End stage renal disease: Secondary | ICD-10-CM | POA: Diagnosis not present

## 2016-12-31 DIAGNOSIS — Z992 Dependence on renal dialysis: Secondary | ICD-10-CM | POA: Diagnosis not present

## 2017-01-03 DIAGNOSIS — N186 End stage renal disease: Secondary | ICD-10-CM | POA: Diagnosis not present

## 2017-01-03 DIAGNOSIS — D631 Anemia in chronic kidney disease: Secondary | ICD-10-CM | POA: Diagnosis not present

## 2017-01-03 DIAGNOSIS — Z992 Dependence on renal dialysis: Secondary | ICD-10-CM | POA: Diagnosis not present

## 2017-01-03 DIAGNOSIS — N2581 Secondary hyperparathyroidism of renal origin: Secondary | ICD-10-CM | POA: Diagnosis not present

## 2017-01-03 DIAGNOSIS — E611 Iron deficiency: Secondary | ICD-10-CM | POA: Diagnosis not present

## 2017-01-05 DIAGNOSIS — N186 End stage renal disease: Secondary | ICD-10-CM | POA: Diagnosis not present

## 2017-01-05 DIAGNOSIS — Z992 Dependence on renal dialysis: Secondary | ICD-10-CM | POA: Diagnosis not present

## 2017-01-05 DIAGNOSIS — N2581 Secondary hyperparathyroidism of renal origin: Secondary | ICD-10-CM | POA: Diagnosis not present

## 2017-01-05 DIAGNOSIS — E611 Iron deficiency: Secondary | ICD-10-CM | POA: Diagnosis not present

## 2017-01-05 DIAGNOSIS — D631 Anemia in chronic kidney disease: Secondary | ICD-10-CM | POA: Diagnosis not present

## 2017-01-07 DIAGNOSIS — Z992 Dependence on renal dialysis: Secondary | ICD-10-CM | POA: Diagnosis not present

## 2017-01-07 DIAGNOSIS — N2581 Secondary hyperparathyroidism of renal origin: Secondary | ICD-10-CM | POA: Diagnosis not present

## 2017-01-07 DIAGNOSIS — D631 Anemia in chronic kidney disease: Secondary | ICD-10-CM | POA: Diagnosis not present

## 2017-01-07 DIAGNOSIS — E611 Iron deficiency: Secondary | ICD-10-CM | POA: Diagnosis not present

## 2017-01-07 DIAGNOSIS — N186 End stage renal disease: Secondary | ICD-10-CM | POA: Diagnosis not present

## 2017-01-10 DIAGNOSIS — E611 Iron deficiency: Secondary | ICD-10-CM | POA: Diagnosis not present

## 2017-01-10 DIAGNOSIS — J069 Acute upper respiratory infection, unspecified: Secondary | ICD-10-CM | POA: Diagnosis not present

## 2017-01-10 DIAGNOSIS — E7800 Pure hypercholesterolemia, unspecified: Secondary | ICD-10-CM | POA: Diagnosis not present

## 2017-01-10 DIAGNOSIS — Z789 Other specified health status: Secondary | ICD-10-CM | POA: Diagnosis not present

## 2017-01-10 DIAGNOSIS — Z6835 Body mass index (BMI) 35.0-35.9, adult: Secondary | ICD-10-CM | POA: Diagnosis not present

## 2017-01-10 DIAGNOSIS — N2581 Secondary hyperparathyroidism of renal origin: Secondary | ICD-10-CM | POA: Diagnosis not present

## 2017-01-10 DIAGNOSIS — Z992 Dependence on renal dialysis: Secondary | ICD-10-CM | POA: Diagnosis not present

## 2017-01-10 DIAGNOSIS — D631 Anemia in chronic kidney disease: Secondary | ICD-10-CM | POA: Diagnosis not present

## 2017-01-10 DIAGNOSIS — Z713 Dietary counseling and surveillance: Secondary | ICD-10-CM | POA: Diagnosis not present

## 2017-01-10 DIAGNOSIS — Z299 Encounter for prophylactic measures, unspecified: Secondary | ICD-10-CM | POA: Diagnosis not present

## 2017-01-10 DIAGNOSIS — N19 Unspecified kidney failure: Secondary | ICD-10-CM | POA: Diagnosis not present

## 2017-01-10 DIAGNOSIS — N186 End stage renal disease: Secondary | ICD-10-CM | POA: Diagnosis not present

## 2017-01-12 DIAGNOSIS — Z992 Dependence on renal dialysis: Secondary | ICD-10-CM | POA: Diagnosis not present

## 2017-01-12 DIAGNOSIS — N2581 Secondary hyperparathyroidism of renal origin: Secondary | ICD-10-CM | POA: Diagnosis not present

## 2017-01-12 DIAGNOSIS — E611 Iron deficiency: Secondary | ICD-10-CM | POA: Diagnosis not present

## 2017-01-12 DIAGNOSIS — D631 Anemia in chronic kidney disease: Secondary | ICD-10-CM | POA: Diagnosis not present

## 2017-01-12 DIAGNOSIS — N186 End stage renal disease: Secondary | ICD-10-CM | POA: Diagnosis not present

## 2017-01-14 DIAGNOSIS — Z299 Encounter for prophylactic measures, unspecified: Secondary | ICD-10-CM | POA: Diagnosis not present

## 2017-01-14 DIAGNOSIS — Z992 Dependence on renal dialysis: Secondary | ICD-10-CM | POA: Diagnosis not present

## 2017-01-14 DIAGNOSIS — Z713 Dietary counseling and surveillance: Secondary | ICD-10-CM | POA: Diagnosis not present

## 2017-01-14 DIAGNOSIS — G4700 Insomnia, unspecified: Secondary | ICD-10-CM | POA: Diagnosis not present

## 2017-01-14 DIAGNOSIS — N2581 Secondary hyperparathyroidism of renal origin: Secondary | ICD-10-CM | POA: Diagnosis not present

## 2017-01-14 DIAGNOSIS — E7800 Pure hypercholesterolemia, unspecified: Secondary | ICD-10-CM | POA: Diagnosis not present

## 2017-01-14 DIAGNOSIS — D631 Anemia in chronic kidney disease: Secondary | ICD-10-CM | POA: Diagnosis not present

## 2017-01-14 DIAGNOSIS — N186 End stage renal disease: Secondary | ICD-10-CM | POA: Diagnosis not present

## 2017-01-14 DIAGNOSIS — Z6835 Body mass index (BMI) 35.0-35.9, adult: Secondary | ICD-10-CM | POA: Diagnosis not present

## 2017-01-14 DIAGNOSIS — E611 Iron deficiency: Secondary | ICD-10-CM | POA: Diagnosis not present

## 2017-01-14 DIAGNOSIS — J069 Acute upper respiratory infection, unspecified: Secondary | ICD-10-CM | POA: Diagnosis not present

## 2017-01-14 DIAGNOSIS — D509 Iron deficiency anemia, unspecified: Secondary | ICD-10-CM | POA: Diagnosis not present

## 2017-01-14 DIAGNOSIS — Z789 Other specified health status: Secondary | ICD-10-CM | POA: Diagnosis not present

## 2017-01-17 DIAGNOSIS — N186 End stage renal disease: Secondary | ICD-10-CM | POA: Diagnosis not present

## 2017-01-17 DIAGNOSIS — N2581 Secondary hyperparathyroidism of renal origin: Secondary | ICD-10-CM | POA: Diagnosis not present

## 2017-01-17 DIAGNOSIS — Z992 Dependence on renal dialysis: Secondary | ICD-10-CM | POA: Diagnosis not present

## 2017-01-17 DIAGNOSIS — D631 Anemia in chronic kidney disease: Secondary | ICD-10-CM | POA: Diagnosis not present

## 2017-01-17 DIAGNOSIS — D509 Iron deficiency anemia, unspecified: Secondary | ICD-10-CM | POA: Diagnosis not present

## 2017-01-17 DIAGNOSIS — E611 Iron deficiency: Secondary | ICD-10-CM | POA: Diagnosis not present

## 2017-01-19 DIAGNOSIS — E611 Iron deficiency: Secondary | ICD-10-CM | POA: Diagnosis not present

## 2017-01-19 DIAGNOSIS — D631 Anemia in chronic kidney disease: Secondary | ICD-10-CM | POA: Diagnosis not present

## 2017-01-19 DIAGNOSIS — N2581 Secondary hyperparathyroidism of renal origin: Secondary | ICD-10-CM | POA: Diagnosis not present

## 2017-01-19 DIAGNOSIS — D509 Iron deficiency anemia, unspecified: Secondary | ICD-10-CM | POA: Diagnosis not present

## 2017-01-19 DIAGNOSIS — Z992 Dependence on renal dialysis: Secondary | ICD-10-CM | POA: Diagnosis not present

## 2017-01-19 DIAGNOSIS — N186 End stage renal disease: Secondary | ICD-10-CM | POA: Diagnosis not present

## 2017-01-21 DIAGNOSIS — N2581 Secondary hyperparathyroidism of renal origin: Secondary | ICD-10-CM | POA: Diagnosis not present

## 2017-01-21 DIAGNOSIS — E611 Iron deficiency: Secondary | ICD-10-CM | POA: Diagnosis not present

## 2017-01-21 DIAGNOSIS — D509 Iron deficiency anemia, unspecified: Secondary | ICD-10-CM | POA: Diagnosis not present

## 2017-01-21 DIAGNOSIS — D631 Anemia in chronic kidney disease: Secondary | ICD-10-CM | POA: Diagnosis not present

## 2017-01-21 DIAGNOSIS — Z992 Dependence on renal dialysis: Secondary | ICD-10-CM | POA: Diagnosis not present

## 2017-01-21 DIAGNOSIS — N186 End stage renal disease: Secondary | ICD-10-CM | POA: Diagnosis not present

## 2017-01-24 DIAGNOSIS — N2581 Secondary hyperparathyroidism of renal origin: Secondary | ICD-10-CM | POA: Diagnosis not present

## 2017-01-24 DIAGNOSIS — D509 Iron deficiency anemia, unspecified: Secondary | ICD-10-CM | POA: Diagnosis not present

## 2017-01-24 DIAGNOSIS — D631 Anemia in chronic kidney disease: Secondary | ICD-10-CM | POA: Diagnosis not present

## 2017-01-24 DIAGNOSIS — N186 End stage renal disease: Secondary | ICD-10-CM | POA: Diagnosis not present

## 2017-01-24 DIAGNOSIS — Z992 Dependence on renal dialysis: Secondary | ICD-10-CM | POA: Diagnosis not present

## 2017-01-24 DIAGNOSIS — E611 Iron deficiency: Secondary | ICD-10-CM | POA: Diagnosis not present

## 2017-01-26 DIAGNOSIS — D509 Iron deficiency anemia, unspecified: Secondary | ICD-10-CM | POA: Diagnosis not present

## 2017-01-26 DIAGNOSIS — N186 End stage renal disease: Secondary | ICD-10-CM | POA: Diagnosis not present

## 2017-01-26 DIAGNOSIS — E611 Iron deficiency: Secondary | ICD-10-CM | POA: Diagnosis not present

## 2017-01-26 DIAGNOSIS — D631 Anemia in chronic kidney disease: Secondary | ICD-10-CM | POA: Diagnosis not present

## 2017-01-26 DIAGNOSIS — Z992 Dependence on renal dialysis: Secondary | ICD-10-CM | POA: Diagnosis not present

## 2017-01-26 DIAGNOSIS — N2581 Secondary hyperparathyroidism of renal origin: Secondary | ICD-10-CM | POA: Diagnosis not present

## 2017-01-28 DIAGNOSIS — D631 Anemia in chronic kidney disease: Secondary | ICD-10-CM | POA: Diagnosis not present

## 2017-01-28 DIAGNOSIS — N186 End stage renal disease: Secondary | ICD-10-CM | POA: Diagnosis not present

## 2017-01-28 DIAGNOSIS — N2581 Secondary hyperparathyroidism of renal origin: Secondary | ICD-10-CM | POA: Diagnosis not present

## 2017-01-28 DIAGNOSIS — Z992 Dependence on renal dialysis: Secondary | ICD-10-CM | POA: Diagnosis not present

## 2017-01-28 DIAGNOSIS — E611 Iron deficiency: Secondary | ICD-10-CM | POA: Diagnosis not present

## 2017-01-28 DIAGNOSIS — D509 Iron deficiency anemia, unspecified: Secondary | ICD-10-CM | POA: Diagnosis not present

## 2017-01-31 DIAGNOSIS — N186 End stage renal disease: Secondary | ICD-10-CM | POA: Diagnosis not present

## 2017-01-31 DIAGNOSIS — N2581 Secondary hyperparathyroidism of renal origin: Secondary | ICD-10-CM | POA: Diagnosis not present

## 2017-01-31 DIAGNOSIS — E611 Iron deficiency: Secondary | ICD-10-CM | POA: Diagnosis not present

## 2017-01-31 DIAGNOSIS — Z992 Dependence on renal dialysis: Secondary | ICD-10-CM | POA: Diagnosis not present

## 2017-01-31 DIAGNOSIS — D631 Anemia in chronic kidney disease: Secondary | ICD-10-CM | POA: Diagnosis not present

## 2017-01-31 DIAGNOSIS — D509 Iron deficiency anemia, unspecified: Secondary | ICD-10-CM | POA: Diagnosis not present

## 2017-02-02 DIAGNOSIS — D509 Iron deficiency anemia, unspecified: Secondary | ICD-10-CM | POA: Diagnosis not present

## 2017-02-02 DIAGNOSIS — Z992 Dependence on renal dialysis: Secondary | ICD-10-CM | POA: Diagnosis not present

## 2017-02-02 DIAGNOSIS — N2581 Secondary hyperparathyroidism of renal origin: Secondary | ICD-10-CM | POA: Diagnosis not present

## 2017-02-02 DIAGNOSIS — E611 Iron deficiency: Secondary | ICD-10-CM | POA: Diagnosis not present

## 2017-02-02 DIAGNOSIS — D631 Anemia in chronic kidney disease: Secondary | ICD-10-CM | POA: Diagnosis not present

## 2017-02-02 DIAGNOSIS — N186 End stage renal disease: Secondary | ICD-10-CM | POA: Diagnosis not present

## 2017-02-04 DIAGNOSIS — N186 End stage renal disease: Secondary | ICD-10-CM | POA: Diagnosis not present

## 2017-02-04 DIAGNOSIS — N2581 Secondary hyperparathyroidism of renal origin: Secondary | ICD-10-CM | POA: Diagnosis not present

## 2017-02-04 DIAGNOSIS — Z992 Dependence on renal dialysis: Secondary | ICD-10-CM | POA: Diagnosis not present

## 2017-02-04 DIAGNOSIS — E611 Iron deficiency: Secondary | ICD-10-CM | POA: Diagnosis not present

## 2017-02-04 DIAGNOSIS — D631 Anemia in chronic kidney disease: Secondary | ICD-10-CM | POA: Diagnosis not present

## 2017-02-04 DIAGNOSIS — D509 Iron deficiency anemia, unspecified: Secondary | ICD-10-CM | POA: Diagnosis not present

## 2017-02-07 DIAGNOSIS — Z992 Dependence on renal dialysis: Secondary | ICD-10-CM | POA: Diagnosis not present

## 2017-02-07 DIAGNOSIS — N186 End stage renal disease: Secondary | ICD-10-CM | POA: Diagnosis not present

## 2017-02-07 DIAGNOSIS — D509 Iron deficiency anemia, unspecified: Secondary | ICD-10-CM | POA: Diagnosis not present

## 2017-02-07 DIAGNOSIS — D631 Anemia in chronic kidney disease: Secondary | ICD-10-CM | POA: Diagnosis not present

## 2017-02-07 DIAGNOSIS — N2581 Secondary hyperparathyroidism of renal origin: Secondary | ICD-10-CM | POA: Diagnosis not present

## 2017-02-07 DIAGNOSIS — E611 Iron deficiency: Secondary | ICD-10-CM | POA: Diagnosis not present

## 2017-02-09 DIAGNOSIS — D509 Iron deficiency anemia, unspecified: Secondary | ICD-10-CM | POA: Diagnosis not present

## 2017-02-09 DIAGNOSIS — D631 Anemia in chronic kidney disease: Secondary | ICD-10-CM | POA: Diagnosis not present

## 2017-02-09 DIAGNOSIS — Z992 Dependence on renal dialysis: Secondary | ICD-10-CM | POA: Diagnosis not present

## 2017-02-09 DIAGNOSIS — N2581 Secondary hyperparathyroidism of renal origin: Secondary | ICD-10-CM | POA: Diagnosis not present

## 2017-02-09 DIAGNOSIS — N186 End stage renal disease: Secondary | ICD-10-CM | POA: Diagnosis not present

## 2017-02-09 DIAGNOSIS — E611 Iron deficiency: Secondary | ICD-10-CM | POA: Diagnosis not present

## 2017-02-11 DIAGNOSIS — Z992 Dependence on renal dialysis: Secondary | ICD-10-CM | POA: Diagnosis not present

## 2017-02-11 DIAGNOSIS — D631 Anemia in chronic kidney disease: Secondary | ICD-10-CM | POA: Diagnosis not present

## 2017-02-11 DIAGNOSIS — N2581 Secondary hyperparathyroidism of renal origin: Secondary | ICD-10-CM | POA: Diagnosis not present

## 2017-02-11 DIAGNOSIS — D509 Iron deficiency anemia, unspecified: Secondary | ICD-10-CM | POA: Diagnosis not present

## 2017-02-11 DIAGNOSIS — N186 End stage renal disease: Secondary | ICD-10-CM | POA: Diagnosis not present

## 2017-02-11 DIAGNOSIS — E611 Iron deficiency: Secondary | ICD-10-CM | POA: Diagnosis not present

## 2017-02-12 DIAGNOSIS — Z992 Dependence on renal dialysis: Secondary | ICD-10-CM | POA: Diagnosis not present

## 2017-02-12 DIAGNOSIS — N186 End stage renal disease: Secondary | ICD-10-CM | POA: Diagnosis not present

## 2017-02-14 DIAGNOSIS — E611 Iron deficiency: Secondary | ICD-10-CM | POA: Diagnosis not present

## 2017-02-14 DIAGNOSIS — N2581 Secondary hyperparathyroidism of renal origin: Secondary | ICD-10-CM | POA: Diagnosis not present

## 2017-02-14 DIAGNOSIS — Z992 Dependence on renal dialysis: Secondary | ICD-10-CM | POA: Diagnosis not present

## 2017-02-14 DIAGNOSIS — D631 Anemia in chronic kidney disease: Secondary | ICD-10-CM | POA: Diagnosis not present

## 2017-02-14 DIAGNOSIS — N186 End stage renal disease: Secondary | ICD-10-CM | POA: Diagnosis not present

## 2017-02-16 DIAGNOSIS — Z992 Dependence on renal dialysis: Secondary | ICD-10-CM | POA: Diagnosis not present

## 2017-02-16 DIAGNOSIS — N2581 Secondary hyperparathyroidism of renal origin: Secondary | ICD-10-CM | POA: Diagnosis not present

## 2017-02-16 DIAGNOSIS — E611 Iron deficiency: Secondary | ICD-10-CM | POA: Diagnosis not present

## 2017-02-16 DIAGNOSIS — D631 Anemia in chronic kidney disease: Secondary | ICD-10-CM | POA: Diagnosis not present

## 2017-02-16 DIAGNOSIS — N186 End stage renal disease: Secondary | ICD-10-CM | POA: Diagnosis not present

## 2017-02-18 DIAGNOSIS — D631 Anemia in chronic kidney disease: Secondary | ICD-10-CM | POA: Diagnosis not present

## 2017-02-18 DIAGNOSIS — N2581 Secondary hyperparathyroidism of renal origin: Secondary | ICD-10-CM | POA: Diagnosis not present

## 2017-02-18 DIAGNOSIS — Z992 Dependence on renal dialysis: Secondary | ICD-10-CM | POA: Diagnosis not present

## 2017-02-18 DIAGNOSIS — E611 Iron deficiency: Secondary | ICD-10-CM | POA: Diagnosis not present

## 2017-02-18 DIAGNOSIS — N186 End stage renal disease: Secondary | ICD-10-CM | POA: Diagnosis not present

## 2017-02-21 DIAGNOSIS — N2581 Secondary hyperparathyroidism of renal origin: Secondary | ICD-10-CM | POA: Diagnosis not present

## 2017-02-21 DIAGNOSIS — Z992 Dependence on renal dialysis: Secondary | ICD-10-CM | POA: Diagnosis not present

## 2017-02-21 DIAGNOSIS — D631 Anemia in chronic kidney disease: Secondary | ICD-10-CM | POA: Diagnosis not present

## 2017-02-21 DIAGNOSIS — N186 End stage renal disease: Secondary | ICD-10-CM | POA: Diagnosis not present

## 2017-02-21 DIAGNOSIS — E611 Iron deficiency: Secondary | ICD-10-CM | POA: Diagnosis not present

## 2017-02-23 DIAGNOSIS — D631 Anemia in chronic kidney disease: Secondary | ICD-10-CM | POA: Diagnosis not present

## 2017-02-23 DIAGNOSIS — N2581 Secondary hyperparathyroidism of renal origin: Secondary | ICD-10-CM | POA: Diagnosis not present

## 2017-02-23 DIAGNOSIS — N186 End stage renal disease: Secondary | ICD-10-CM | POA: Diagnosis not present

## 2017-02-23 DIAGNOSIS — Z992 Dependence on renal dialysis: Secondary | ICD-10-CM | POA: Diagnosis not present

## 2017-02-23 DIAGNOSIS — E611 Iron deficiency: Secondary | ICD-10-CM | POA: Diagnosis not present

## 2017-02-25 DIAGNOSIS — Z992 Dependence on renal dialysis: Secondary | ICD-10-CM | POA: Diagnosis not present

## 2017-02-25 DIAGNOSIS — E611 Iron deficiency: Secondary | ICD-10-CM | POA: Diagnosis not present

## 2017-02-25 DIAGNOSIS — D631 Anemia in chronic kidney disease: Secondary | ICD-10-CM | POA: Diagnosis not present

## 2017-02-25 DIAGNOSIS — N2581 Secondary hyperparathyroidism of renal origin: Secondary | ICD-10-CM | POA: Diagnosis not present

## 2017-02-25 DIAGNOSIS — N186 End stage renal disease: Secondary | ICD-10-CM | POA: Diagnosis not present

## 2017-02-28 DIAGNOSIS — D631 Anemia in chronic kidney disease: Secondary | ICD-10-CM | POA: Diagnosis not present

## 2017-02-28 DIAGNOSIS — N186 End stage renal disease: Secondary | ICD-10-CM | POA: Diagnosis not present

## 2017-02-28 DIAGNOSIS — N2581 Secondary hyperparathyroidism of renal origin: Secondary | ICD-10-CM | POA: Diagnosis not present

## 2017-02-28 DIAGNOSIS — Z992 Dependence on renal dialysis: Secondary | ICD-10-CM | POA: Diagnosis not present

## 2017-02-28 DIAGNOSIS — E611 Iron deficiency: Secondary | ICD-10-CM | POA: Diagnosis not present

## 2017-03-02 DIAGNOSIS — N186 End stage renal disease: Secondary | ICD-10-CM | POA: Diagnosis not present

## 2017-03-02 DIAGNOSIS — N2581 Secondary hyperparathyroidism of renal origin: Secondary | ICD-10-CM | POA: Diagnosis not present

## 2017-03-02 DIAGNOSIS — D631 Anemia in chronic kidney disease: Secondary | ICD-10-CM | POA: Diagnosis not present

## 2017-03-02 DIAGNOSIS — Z992 Dependence on renal dialysis: Secondary | ICD-10-CM | POA: Diagnosis not present

## 2017-03-02 DIAGNOSIS — E611 Iron deficiency: Secondary | ICD-10-CM | POA: Diagnosis not present

## 2017-03-04 ENCOUNTER — Other Ambulatory Visit (HOSPITAL_COMMUNITY): Payer: Self-pay | Admitting: Nephrology

## 2017-03-04 DIAGNOSIS — E611 Iron deficiency: Secondary | ICD-10-CM | POA: Diagnosis not present

## 2017-03-04 DIAGNOSIS — Z992 Dependence on renal dialysis: Secondary | ICD-10-CM | POA: Diagnosis not present

## 2017-03-04 DIAGNOSIS — N186 End stage renal disease: Secondary | ICD-10-CM

## 2017-03-04 DIAGNOSIS — D631 Anemia in chronic kidney disease: Secondary | ICD-10-CM | POA: Diagnosis not present

## 2017-03-04 DIAGNOSIS — N2581 Secondary hyperparathyroidism of renal origin: Secondary | ICD-10-CM | POA: Diagnosis not present

## 2017-03-07 ENCOUNTER — Other Ambulatory Visit (HOSPITAL_COMMUNITY): Payer: Self-pay | Admitting: Nephrology

## 2017-03-07 DIAGNOSIS — Z992 Dependence on renal dialysis: Secondary | ICD-10-CM | POA: Diagnosis not present

## 2017-03-07 DIAGNOSIS — N2581 Secondary hyperparathyroidism of renal origin: Secondary | ICD-10-CM | POA: Diagnosis not present

## 2017-03-07 DIAGNOSIS — N186 End stage renal disease: Secondary | ICD-10-CM | POA: Diagnosis not present

## 2017-03-07 DIAGNOSIS — E611 Iron deficiency: Secondary | ICD-10-CM | POA: Diagnosis not present

## 2017-03-07 DIAGNOSIS — D631 Anemia in chronic kidney disease: Secondary | ICD-10-CM | POA: Diagnosis not present

## 2017-03-08 DIAGNOSIS — Z01419 Encounter for gynecological examination (general) (routine) without abnormal findings: Secondary | ICD-10-CM | POA: Diagnosis not present

## 2017-03-08 DIAGNOSIS — Z992 Dependence on renal dialysis: Secondary | ICD-10-CM | POA: Diagnosis not present

## 2017-03-08 DIAGNOSIS — Z6834 Body mass index (BMI) 34.0-34.9, adult: Secondary | ICD-10-CM | POA: Diagnosis not present

## 2017-03-08 DIAGNOSIS — N186 End stage renal disease: Secondary | ICD-10-CM | POA: Diagnosis not present

## 2017-03-09 DIAGNOSIS — E611 Iron deficiency: Secondary | ICD-10-CM | POA: Diagnosis not present

## 2017-03-09 DIAGNOSIS — D631 Anemia in chronic kidney disease: Secondary | ICD-10-CM | POA: Diagnosis not present

## 2017-03-09 DIAGNOSIS — N2581 Secondary hyperparathyroidism of renal origin: Secondary | ICD-10-CM | POA: Diagnosis not present

## 2017-03-09 DIAGNOSIS — Z992 Dependence on renal dialysis: Secondary | ICD-10-CM | POA: Diagnosis not present

## 2017-03-09 DIAGNOSIS — N186 End stage renal disease: Secondary | ICD-10-CM | POA: Diagnosis not present

## 2017-03-10 ENCOUNTER — Ambulatory Visit (HOSPITAL_COMMUNITY)
Admission: RE | Admit: 2017-03-10 | Discharge: 2017-03-10 | Disposition: A | Discharge status: Home / Self Care | Payer: Medicare Other | Source: Ambulatory Visit | Attending: Nephrology | Admitting: Nephrology

## 2017-03-10 ENCOUNTER — Other Ambulatory Visit (HOSPITAL_COMMUNITY): Payer: Self-pay | Admitting: Nephrology

## 2017-03-10 ENCOUNTER — Encounter (HOSPITAL_COMMUNITY): Payer: Self-pay | Admitting: Interventional Radiology

## 2017-03-10 DIAGNOSIS — Y832 Surgical operation with anastomosis, bypass or graft as the cause of abnormal reaction of the patient, or of later complication, without mention of misadventure at the time of the procedure: Secondary | ICD-10-CM | POA: Insufficient documentation

## 2017-03-10 DIAGNOSIS — Y831 Surgical operation with implant of artificial internal device as the cause of abnormal reaction of the patient, or of later complication, without mention of misadventure at the time of the procedure: Secondary | ICD-10-CM | POA: Diagnosis not present

## 2017-03-10 DIAGNOSIS — T82858A Stenosis of vascular prosthetic devices, implants and grafts, initial encounter: Secondary | ICD-10-CM | POA: Diagnosis not present

## 2017-03-10 DIAGNOSIS — T82856A Stenosis of peripheral vascular stent, initial encounter: Secondary | ICD-10-CM | POA: Diagnosis not present

## 2017-03-10 DIAGNOSIS — I871 Compression of vein: Secondary | ICD-10-CM | POA: Diagnosis not present

## 2017-03-10 DIAGNOSIS — N186 End stage renal disease: Secondary | ICD-10-CM | POA: Insufficient documentation

## 2017-03-10 DIAGNOSIS — Z992 Dependence on renal dialysis: Principal | ICD-10-CM

## 2017-03-10 DIAGNOSIS — T82898A Other specified complication of vascular prosthetic devices, implants and grafts, initial encounter: Secondary | ICD-10-CM | POA: Diagnosis not present

## 2017-03-10 HISTORY — PX: IR AV DIALY SHUNT INTRO NEEDLE/INTRACATH INITIAL W/PTA/IMG LEFT: IMG6103

## 2017-03-10 MED ORDER — IOPAMIDOL (ISOVUE-300) INJECTION 61%
INTRAVENOUS | Status: AC
  Administered 2017-03-10: 50 mL
  Filled 2017-03-10: qty 100

## 2017-03-10 MED ORDER — LIDOCAINE HCL 1 % IJ SOLN
INTRAMUSCULAR | Status: AC
  Filled 2017-03-10: qty 20

## 2017-03-10 NOTE — Procedures (Signed)
Shuntogram Venous PTA x2 No complication No blood loss. See complete dictation in [facility].

## 2017-03-11 DIAGNOSIS — N186 End stage renal disease: Secondary | ICD-10-CM | POA: Diagnosis not present

## 2017-03-11 DIAGNOSIS — D631 Anemia in chronic kidney disease: Secondary | ICD-10-CM | POA: Diagnosis not present

## 2017-03-11 DIAGNOSIS — Z992 Dependence on renal dialysis: Secondary | ICD-10-CM | POA: Diagnosis not present

## 2017-03-11 DIAGNOSIS — E611 Iron deficiency: Secondary | ICD-10-CM | POA: Diagnosis not present

## 2017-03-11 DIAGNOSIS — N2581 Secondary hyperparathyroidism of renal origin: Secondary | ICD-10-CM | POA: Diagnosis not present

## 2017-03-14 DIAGNOSIS — Z992 Dependence on renal dialysis: Secondary | ICD-10-CM | POA: Diagnosis not present

## 2017-03-14 DIAGNOSIS — N186 End stage renal disease: Secondary | ICD-10-CM | POA: Diagnosis not present

## 2017-03-14 DIAGNOSIS — N2581 Secondary hyperparathyroidism of renal origin: Secondary | ICD-10-CM | POA: Diagnosis not present

## 2017-03-14 DIAGNOSIS — D631 Anemia in chronic kidney disease: Secondary | ICD-10-CM | POA: Diagnosis not present

## 2017-03-14 DIAGNOSIS — E611 Iron deficiency: Secondary | ICD-10-CM | POA: Diagnosis not present

## 2017-03-16 DIAGNOSIS — Z992 Dependence on renal dialysis: Secondary | ICD-10-CM | POA: Diagnosis not present

## 2017-03-16 DIAGNOSIS — N2581 Secondary hyperparathyroidism of renal origin: Secondary | ICD-10-CM | POA: Diagnosis not present

## 2017-03-16 DIAGNOSIS — D631 Anemia in chronic kidney disease: Secondary | ICD-10-CM | POA: Diagnosis not present

## 2017-03-16 DIAGNOSIS — E611 Iron deficiency: Secondary | ICD-10-CM | POA: Diagnosis not present

## 2017-03-16 DIAGNOSIS — N186 End stage renal disease: Secondary | ICD-10-CM | POA: Diagnosis not present

## 2017-03-18 DIAGNOSIS — D631 Anemia in chronic kidney disease: Secondary | ICD-10-CM | POA: Diagnosis not present

## 2017-03-18 DIAGNOSIS — N2581 Secondary hyperparathyroidism of renal origin: Secondary | ICD-10-CM | POA: Diagnosis not present

## 2017-03-18 DIAGNOSIS — E611 Iron deficiency: Secondary | ICD-10-CM | POA: Diagnosis not present

## 2017-03-18 DIAGNOSIS — N186 End stage renal disease: Secondary | ICD-10-CM | POA: Diagnosis not present

## 2017-03-18 DIAGNOSIS — Z992 Dependence on renal dialysis: Secondary | ICD-10-CM | POA: Diagnosis not present

## 2017-03-21 DIAGNOSIS — Z992 Dependence on renal dialysis: Secondary | ICD-10-CM | POA: Diagnosis not present

## 2017-03-21 DIAGNOSIS — E611 Iron deficiency: Secondary | ICD-10-CM | POA: Diagnosis not present

## 2017-03-21 DIAGNOSIS — D631 Anemia in chronic kidney disease: Secondary | ICD-10-CM | POA: Diagnosis not present

## 2017-03-21 DIAGNOSIS — N2581 Secondary hyperparathyroidism of renal origin: Secondary | ICD-10-CM | POA: Diagnosis not present

## 2017-03-21 DIAGNOSIS — N186 End stage renal disease: Secondary | ICD-10-CM | POA: Diagnosis not present

## 2017-03-23 DIAGNOSIS — Z992 Dependence on renal dialysis: Secondary | ICD-10-CM | POA: Diagnosis not present

## 2017-03-23 DIAGNOSIS — D631 Anemia in chronic kidney disease: Secondary | ICD-10-CM | POA: Diagnosis not present

## 2017-03-23 DIAGNOSIS — E611 Iron deficiency: Secondary | ICD-10-CM | POA: Diagnosis not present

## 2017-03-23 DIAGNOSIS — N186 End stage renal disease: Secondary | ICD-10-CM | POA: Diagnosis not present

## 2017-03-23 DIAGNOSIS — N2581 Secondary hyperparathyroidism of renal origin: Secondary | ICD-10-CM | POA: Diagnosis not present

## 2017-03-25 DIAGNOSIS — N186 End stage renal disease: Secondary | ICD-10-CM | POA: Diagnosis not present

## 2017-03-25 DIAGNOSIS — D631 Anemia in chronic kidney disease: Secondary | ICD-10-CM | POA: Diagnosis not present

## 2017-03-25 DIAGNOSIS — Z992 Dependence on renal dialysis: Secondary | ICD-10-CM | POA: Diagnosis not present

## 2017-03-25 DIAGNOSIS — N2581 Secondary hyperparathyroidism of renal origin: Secondary | ICD-10-CM | POA: Diagnosis not present

## 2017-03-25 DIAGNOSIS — E611 Iron deficiency: Secondary | ICD-10-CM | POA: Diagnosis not present

## 2017-03-28 DIAGNOSIS — D631 Anemia in chronic kidney disease: Secondary | ICD-10-CM | POA: Diagnosis not present

## 2017-03-28 DIAGNOSIS — E611 Iron deficiency: Secondary | ICD-10-CM | POA: Diagnosis not present

## 2017-03-28 DIAGNOSIS — N186 End stage renal disease: Secondary | ICD-10-CM | POA: Diagnosis not present

## 2017-03-28 DIAGNOSIS — N2581 Secondary hyperparathyroidism of renal origin: Secondary | ICD-10-CM | POA: Diagnosis not present

## 2017-03-28 DIAGNOSIS — Z992 Dependence on renal dialysis: Secondary | ICD-10-CM | POA: Diagnosis not present

## 2017-03-30 DIAGNOSIS — E611 Iron deficiency: Secondary | ICD-10-CM | POA: Diagnosis not present

## 2017-03-30 DIAGNOSIS — N2581 Secondary hyperparathyroidism of renal origin: Secondary | ICD-10-CM | POA: Diagnosis not present

## 2017-03-30 DIAGNOSIS — Z992 Dependence on renal dialysis: Secondary | ICD-10-CM | POA: Diagnosis not present

## 2017-03-30 DIAGNOSIS — D631 Anemia in chronic kidney disease: Secondary | ICD-10-CM | POA: Diagnosis not present

## 2017-03-30 DIAGNOSIS — N186 End stage renal disease: Secondary | ICD-10-CM | POA: Diagnosis not present

## 2017-04-01 DIAGNOSIS — N186 End stage renal disease: Secondary | ICD-10-CM | POA: Diagnosis not present

## 2017-04-01 DIAGNOSIS — E611 Iron deficiency: Secondary | ICD-10-CM | POA: Diagnosis not present

## 2017-04-01 DIAGNOSIS — Z992 Dependence on renal dialysis: Secondary | ICD-10-CM | POA: Diagnosis not present

## 2017-04-01 DIAGNOSIS — N2581 Secondary hyperparathyroidism of renal origin: Secondary | ICD-10-CM | POA: Diagnosis not present

## 2017-04-01 DIAGNOSIS — D631 Anemia in chronic kidney disease: Secondary | ICD-10-CM | POA: Diagnosis not present

## 2017-04-04 DIAGNOSIS — Z992 Dependence on renal dialysis: Secondary | ICD-10-CM | POA: Diagnosis not present

## 2017-04-04 DIAGNOSIS — N2581 Secondary hyperparathyroidism of renal origin: Secondary | ICD-10-CM | POA: Diagnosis not present

## 2017-04-04 DIAGNOSIS — D631 Anemia in chronic kidney disease: Secondary | ICD-10-CM | POA: Diagnosis not present

## 2017-04-04 DIAGNOSIS — E611 Iron deficiency: Secondary | ICD-10-CM | POA: Diagnosis not present

## 2017-04-04 DIAGNOSIS — N186 End stage renal disease: Secondary | ICD-10-CM | POA: Diagnosis not present

## 2017-04-06 DIAGNOSIS — Z992 Dependence on renal dialysis: Secondary | ICD-10-CM | POA: Diagnosis not present

## 2017-04-06 DIAGNOSIS — E611 Iron deficiency: Secondary | ICD-10-CM | POA: Diagnosis not present

## 2017-04-06 DIAGNOSIS — N2581 Secondary hyperparathyroidism of renal origin: Secondary | ICD-10-CM | POA: Diagnosis not present

## 2017-04-06 DIAGNOSIS — D631 Anemia in chronic kidney disease: Secondary | ICD-10-CM | POA: Diagnosis not present

## 2017-04-06 DIAGNOSIS — N186 End stage renal disease: Secondary | ICD-10-CM | POA: Diagnosis not present

## 2017-04-08 DIAGNOSIS — D631 Anemia in chronic kidney disease: Secondary | ICD-10-CM | POA: Diagnosis not present

## 2017-04-08 DIAGNOSIS — E611 Iron deficiency: Secondary | ICD-10-CM | POA: Diagnosis not present

## 2017-04-08 DIAGNOSIS — N2581 Secondary hyperparathyroidism of renal origin: Secondary | ICD-10-CM | POA: Diagnosis not present

## 2017-04-08 DIAGNOSIS — N186 End stage renal disease: Secondary | ICD-10-CM | POA: Diagnosis not present

## 2017-04-08 DIAGNOSIS — Z992 Dependence on renal dialysis: Secondary | ICD-10-CM | POA: Diagnosis not present

## 2017-04-11 DIAGNOSIS — N2581 Secondary hyperparathyroidism of renal origin: Secondary | ICD-10-CM | POA: Diagnosis not present

## 2017-04-11 DIAGNOSIS — E611 Iron deficiency: Secondary | ICD-10-CM | POA: Diagnosis not present

## 2017-04-11 DIAGNOSIS — N186 End stage renal disease: Secondary | ICD-10-CM | POA: Diagnosis not present

## 2017-04-11 DIAGNOSIS — Z992 Dependence on renal dialysis: Secondary | ICD-10-CM | POA: Diagnosis not present

## 2017-04-11 DIAGNOSIS — D631 Anemia in chronic kidney disease: Secondary | ICD-10-CM | POA: Diagnosis not present

## 2017-04-13 DIAGNOSIS — Z992 Dependence on renal dialysis: Secondary | ICD-10-CM | POA: Diagnosis not present

## 2017-04-13 DIAGNOSIS — D631 Anemia in chronic kidney disease: Secondary | ICD-10-CM | POA: Diagnosis not present

## 2017-04-13 DIAGNOSIS — N186 End stage renal disease: Secondary | ICD-10-CM | POA: Diagnosis not present

## 2017-04-13 DIAGNOSIS — N2581 Secondary hyperparathyroidism of renal origin: Secondary | ICD-10-CM | POA: Diagnosis not present

## 2017-04-13 DIAGNOSIS — E611 Iron deficiency: Secondary | ICD-10-CM | POA: Diagnosis not present

## 2017-04-14 DIAGNOSIS — N186 End stage renal disease: Secondary | ICD-10-CM | POA: Diagnosis not present

## 2017-04-14 DIAGNOSIS — Z992 Dependence on renal dialysis: Secondary | ICD-10-CM | POA: Diagnosis not present

## 2017-04-15 DIAGNOSIS — E611 Iron deficiency: Secondary | ICD-10-CM | POA: Diagnosis not present

## 2017-04-15 DIAGNOSIS — N2581 Secondary hyperparathyroidism of renal origin: Secondary | ICD-10-CM | POA: Diagnosis not present

## 2017-04-15 DIAGNOSIS — D509 Iron deficiency anemia, unspecified: Secondary | ICD-10-CM | POA: Diagnosis not present

## 2017-04-15 DIAGNOSIS — D631 Anemia in chronic kidney disease: Secondary | ICD-10-CM | POA: Diagnosis not present

## 2017-04-15 DIAGNOSIS — N186 End stage renal disease: Secondary | ICD-10-CM | POA: Diagnosis not present

## 2017-04-15 DIAGNOSIS — Z992 Dependence on renal dialysis: Secondary | ICD-10-CM | POA: Diagnosis not present

## 2017-04-18 ENCOUNTER — Other Ambulatory Visit (HOSPITAL_COMMUNITY): Payer: Self-pay | Admitting: Nephrology

## 2017-04-18 DIAGNOSIS — D631 Anemia in chronic kidney disease: Secondary | ICD-10-CM | POA: Diagnosis not present

## 2017-04-18 DIAGNOSIS — N2581 Secondary hyperparathyroidism of renal origin: Secondary | ICD-10-CM | POA: Diagnosis not present

## 2017-04-18 DIAGNOSIS — N186 End stage renal disease: Secondary | ICD-10-CM | POA: Diagnosis not present

## 2017-04-18 DIAGNOSIS — Z992 Dependence on renal dialysis: Secondary | ICD-10-CM | POA: Diagnosis not present

## 2017-04-18 DIAGNOSIS — E611 Iron deficiency: Secondary | ICD-10-CM | POA: Diagnosis not present

## 2017-04-18 DIAGNOSIS — D509 Iron deficiency anemia, unspecified: Secondary | ICD-10-CM | POA: Diagnosis not present

## 2017-04-19 ENCOUNTER — Encounter: Payer: Self-pay | Admitting: Interventional Radiology

## 2017-04-19 ENCOUNTER — Other Ambulatory Visit (HOSPITAL_COMMUNITY): Payer: Self-pay | Admitting: Nephrology

## 2017-04-19 ENCOUNTER — Ambulatory Visit (HOSPITAL_COMMUNITY)
Admission: RE | Admit: 2017-04-19 | Discharge: 2017-04-19 | Disposition: A | Discharge status: Home / Self Care | Payer: Medicare Other | Source: Ambulatory Visit | Attending: Nephrology | Admitting: Nephrology

## 2017-04-19 DIAGNOSIS — K210 Gastro-esophageal reflux disease with esophagitis: Secondary | ICD-10-CM | POA: Diagnosis not present

## 2017-04-19 DIAGNOSIS — T82858A Stenosis of vascular prosthetic devices, implants and grafts, initial encounter: Secondary | ICD-10-CM | POA: Insufficient documentation

## 2017-04-19 DIAGNOSIS — Z992 Dependence on renal dialysis: Secondary | ICD-10-CM | POA: Insufficient documentation

## 2017-04-19 DIAGNOSIS — N186 End stage renal disease: Secondary | ICD-10-CM | POA: Diagnosis not present

## 2017-04-19 DIAGNOSIS — N19 Unspecified kidney failure: Secondary | ICD-10-CM | POA: Diagnosis not present

## 2017-04-19 DIAGNOSIS — Z299 Encounter for prophylactic measures, unspecified: Secondary | ICD-10-CM | POA: Diagnosis not present

## 2017-04-19 DIAGNOSIS — E7800 Pure hypercholesterolemia, unspecified: Secondary | ICD-10-CM | POA: Diagnosis not present

## 2017-04-19 DIAGNOSIS — Y832 Surgical operation with anastomosis, bypass or graft as the cause of abnormal reaction of the patient, or of later complication, without mention of misadventure at the time of the procedure: Secondary | ICD-10-CM | POA: Insufficient documentation

## 2017-04-19 DIAGNOSIS — Z6835 Body mass index (BMI) 35.0-35.9, adult: Secondary | ICD-10-CM | POA: Diagnosis not present

## 2017-04-19 HISTORY — PX: IR US GUIDE VASC ACCESS LEFT: IMG2389

## 2017-04-19 HISTORY — PX: IR AV DIALY SHUNT INTRO NEEDLE/INTRACATH INITIAL W/PTA/IMG LEFT: IMG6103

## 2017-04-19 MED ORDER — LIDOCAINE HCL 1 % IJ SOLN
INTRAMUSCULAR | Status: AC
  Filled 2017-04-19: qty 20

## 2017-04-19 MED ORDER — IOPAMIDOL (ISOVUE-300) INJECTION 61%
INTRAVENOUS | Status: AC
  Filled 2017-04-19: qty 100

## 2017-04-19 MED ORDER — IOPAMIDOL (ISOVUE-300) INJECTION 61%
INTRAVENOUS | Status: AC
  Administered 2017-04-19: 35 mL
  Filled 2017-04-19: qty 50

## 2017-04-19 MED ORDER — LIDOCAINE HCL 1 % IJ SOLN
INTRAMUSCULAR | Status: DC | PRN
  Administered 2017-04-19: 20 mL

## 2017-04-19 NOTE — Procedures (Signed)
Interventional Radiology Procedure Note  Procedure: 1.) Fistulogram 2.) PTA of inflow stenosis to 6 mm 3.) PTA subclavian stenosis at central edge of previously placed stent to 10 mm   Complications: None  Estimated Blood Loss: None  Recommendations: - Resume dialysis - Remove sutures after next dialysis session.   Signed,  Criselda Peaches, MD

## 2017-04-20 DIAGNOSIS — E611 Iron deficiency: Secondary | ICD-10-CM | POA: Diagnosis not present

## 2017-04-20 DIAGNOSIS — D509 Iron deficiency anemia, unspecified: Secondary | ICD-10-CM | POA: Diagnosis not present

## 2017-04-20 DIAGNOSIS — N186 End stage renal disease: Secondary | ICD-10-CM | POA: Diagnosis not present

## 2017-04-20 DIAGNOSIS — N2581 Secondary hyperparathyroidism of renal origin: Secondary | ICD-10-CM | POA: Diagnosis not present

## 2017-04-20 DIAGNOSIS — D631 Anemia in chronic kidney disease: Secondary | ICD-10-CM | POA: Diagnosis not present

## 2017-04-20 DIAGNOSIS — Z992 Dependence on renal dialysis: Secondary | ICD-10-CM | POA: Diagnosis not present

## 2017-04-22 DIAGNOSIS — N186 End stage renal disease: Secondary | ICD-10-CM | POA: Diagnosis not present

## 2017-04-22 DIAGNOSIS — E611 Iron deficiency: Secondary | ICD-10-CM | POA: Diagnosis not present

## 2017-04-22 DIAGNOSIS — D509 Iron deficiency anemia, unspecified: Secondary | ICD-10-CM | POA: Diagnosis not present

## 2017-04-22 DIAGNOSIS — D631 Anemia in chronic kidney disease: Secondary | ICD-10-CM | POA: Diagnosis not present

## 2017-04-22 DIAGNOSIS — N2581 Secondary hyperparathyroidism of renal origin: Secondary | ICD-10-CM | POA: Diagnosis not present

## 2017-04-22 DIAGNOSIS — Z992 Dependence on renal dialysis: Secondary | ICD-10-CM | POA: Diagnosis not present

## 2017-04-25 DIAGNOSIS — D631 Anemia in chronic kidney disease: Secondary | ICD-10-CM | POA: Diagnosis not present

## 2017-04-25 DIAGNOSIS — N2581 Secondary hyperparathyroidism of renal origin: Secondary | ICD-10-CM | POA: Diagnosis not present

## 2017-04-25 DIAGNOSIS — E611 Iron deficiency: Secondary | ICD-10-CM | POA: Diagnosis not present

## 2017-04-25 DIAGNOSIS — Z992 Dependence on renal dialysis: Secondary | ICD-10-CM | POA: Diagnosis not present

## 2017-04-25 DIAGNOSIS — N186 End stage renal disease: Secondary | ICD-10-CM | POA: Diagnosis not present

## 2017-04-25 DIAGNOSIS — D509 Iron deficiency anemia, unspecified: Secondary | ICD-10-CM | POA: Diagnosis not present

## 2017-04-27 DIAGNOSIS — N186 End stage renal disease: Secondary | ICD-10-CM | POA: Diagnosis not present

## 2017-04-27 DIAGNOSIS — Z992 Dependence on renal dialysis: Secondary | ICD-10-CM | POA: Diagnosis not present

## 2017-04-27 DIAGNOSIS — D631 Anemia in chronic kidney disease: Secondary | ICD-10-CM | POA: Diagnosis not present

## 2017-04-27 DIAGNOSIS — N2581 Secondary hyperparathyroidism of renal origin: Secondary | ICD-10-CM | POA: Diagnosis not present

## 2017-04-29 ENCOUNTER — Encounter (HOSPITAL_COMMUNITY): Payer: Self-pay | Admitting: Interventional Radiology

## 2017-04-29 DIAGNOSIS — D631 Anemia in chronic kidney disease: Secondary | ICD-10-CM | POA: Diagnosis not present

## 2017-04-29 DIAGNOSIS — N2581 Secondary hyperparathyroidism of renal origin: Secondary | ICD-10-CM | POA: Diagnosis not present

## 2017-04-29 DIAGNOSIS — Z992 Dependence on renal dialysis: Secondary | ICD-10-CM | POA: Diagnosis not present

## 2017-04-29 DIAGNOSIS — N186 End stage renal disease: Secondary | ICD-10-CM | POA: Diagnosis not present

## 2017-05-02 DIAGNOSIS — D631 Anemia in chronic kidney disease: Secondary | ICD-10-CM | POA: Diagnosis not present

## 2017-05-02 DIAGNOSIS — E611 Iron deficiency: Secondary | ICD-10-CM | POA: Diagnosis not present

## 2017-05-02 DIAGNOSIS — N186 End stage renal disease: Secondary | ICD-10-CM | POA: Diagnosis not present

## 2017-05-02 DIAGNOSIS — N2581 Secondary hyperparathyroidism of renal origin: Secondary | ICD-10-CM | POA: Diagnosis not present

## 2017-05-02 DIAGNOSIS — D509 Iron deficiency anemia, unspecified: Secondary | ICD-10-CM | POA: Diagnosis not present

## 2017-05-02 DIAGNOSIS — Z992 Dependence on renal dialysis: Secondary | ICD-10-CM | POA: Diagnosis not present

## 2017-05-04 DIAGNOSIS — N186 End stage renal disease: Secondary | ICD-10-CM | POA: Diagnosis not present

## 2017-05-04 DIAGNOSIS — Z992 Dependence on renal dialysis: Secondary | ICD-10-CM | POA: Diagnosis not present

## 2017-05-04 DIAGNOSIS — N2581 Secondary hyperparathyroidism of renal origin: Secondary | ICD-10-CM | POA: Diagnosis not present

## 2017-05-04 DIAGNOSIS — D631 Anemia in chronic kidney disease: Secondary | ICD-10-CM | POA: Diagnosis not present

## 2017-05-04 DIAGNOSIS — E611 Iron deficiency: Secondary | ICD-10-CM | POA: Diagnosis not present

## 2017-05-04 DIAGNOSIS — D509 Iron deficiency anemia, unspecified: Secondary | ICD-10-CM | POA: Diagnosis not present

## 2017-05-06 DIAGNOSIS — Z992 Dependence on renal dialysis: Secondary | ICD-10-CM | POA: Diagnosis not present

## 2017-05-06 DIAGNOSIS — D631 Anemia in chronic kidney disease: Secondary | ICD-10-CM | POA: Diagnosis not present

## 2017-05-06 DIAGNOSIS — N186 End stage renal disease: Secondary | ICD-10-CM | POA: Diagnosis not present

## 2017-05-06 DIAGNOSIS — E611 Iron deficiency: Secondary | ICD-10-CM | POA: Diagnosis not present

## 2017-05-06 DIAGNOSIS — D509 Iron deficiency anemia, unspecified: Secondary | ICD-10-CM | POA: Diagnosis not present

## 2017-05-06 DIAGNOSIS — N2581 Secondary hyperparathyroidism of renal origin: Secondary | ICD-10-CM | POA: Diagnosis not present

## 2017-05-09 DIAGNOSIS — D509 Iron deficiency anemia, unspecified: Secondary | ICD-10-CM | POA: Diagnosis not present

## 2017-05-09 DIAGNOSIS — N2581 Secondary hyperparathyroidism of renal origin: Secondary | ICD-10-CM | POA: Diagnosis not present

## 2017-05-09 DIAGNOSIS — D631 Anemia in chronic kidney disease: Secondary | ICD-10-CM | POA: Diagnosis not present

## 2017-05-09 DIAGNOSIS — N186 End stage renal disease: Secondary | ICD-10-CM | POA: Diagnosis not present

## 2017-05-09 DIAGNOSIS — Z992 Dependence on renal dialysis: Secondary | ICD-10-CM | POA: Diagnosis not present

## 2017-05-09 DIAGNOSIS — E611 Iron deficiency: Secondary | ICD-10-CM | POA: Diagnosis not present

## 2017-05-11 DIAGNOSIS — N186 End stage renal disease: Secondary | ICD-10-CM | POA: Diagnosis not present

## 2017-05-11 DIAGNOSIS — N2581 Secondary hyperparathyroidism of renal origin: Secondary | ICD-10-CM | POA: Diagnosis not present

## 2017-05-11 DIAGNOSIS — E611 Iron deficiency: Secondary | ICD-10-CM | POA: Diagnosis not present

## 2017-05-11 DIAGNOSIS — Z992 Dependence on renal dialysis: Secondary | ICD-10-CM | POA: Diagnosis not present

## 2017-05-11 DIAGNOSIS — D509 Iron deficiency anemia, unspecified: Secondary | ICD-10-CM | POA: Diagnosis not present

## 2017-05-11 DIAGNOSIS — D631 Anemia in chronic kidney disease: Secondary | ICD-10-CM | POA: Diagnosis not present

## 2017-05-13 DIAGNOSIS — Z992 Dependence on renal dialysis: Secondary | ICD-10-CM | POA: Diagnosis not present

## 2017-05-13 DIAGNOSIS — N2581 Secondary hyperparathyroidism of renal origin: Secondary | ICD-10-CM | POA: Diagnosis not present

## 2017-05-13 DIAGNOSIS — E611 Iron deficiency: Secondary | ICD-10-CM | POA: Diagnosis not present

## 2017-05-13 DIAGNOSIS — D509 Iron deficiency anemia, unspecified: Secondary | ICD-10-CM | POA: Diagnosis not present

## 2017-05-13 DIAGNOSIS — D631 Anemia in chronic kidney disease: Secondary | ICD-10-CM | POA: Diagnosis not present

## 2017-05-13 DIAGNOSIS — N186 End stage renal disease: Secondary | ICD-10-CM | POA: Diagnosis not present

## 2017-05-14 DIAGNOSIS — Z992 Dependence on renal dialysis: Secondary | ICD-10-CM | POA: Diagnosis not present

## 2017-05-14 DIAGNOSIS — N186 End stage renal disease: Secondary | ICD-10-CM | POA: Diagnosis not present

## 2017-05-16 DIAGNOSIS — E611 Iron deficiency: Secondary | ICD-10-CM | POA: Diagnosis not present

## 2017-05-16 DIAGNOSIS — Z992 Dependence on renal dialysis: Secondary | ICD-10-CM | POA: Diagnosis not present

## 2017-05-16 DIAGNOSIS — D631 Anemia in chronic kidney disease: Secondary | ICD-10-CM | POA: Diagnosis not present

## 2017-05-16 DIAGNOSIS — N186 End stage renal disease: Secondary | ICD-10-CM | POA: Diagnosis not present

## 2017-05-16 DIAGNOSIS — N2581 Secondary hyperparathyroidism of renal origin: Secondary | ICD-10-CM | POA: Diagnosis not present

## 2017-05-18 DIAGNOSIS — Z992 Dependence on renal dialysis: Secondary | ICD-10-CM | POA: Diagnosis not present

## 2017-05-18 DIAGNOSIS — D631 Anemia in chronic kidney disease: Secondary | ICD-10-CM | POA: Diagnosis not present

## 2017-05-18 DIAGNOSIS — N186 End stage renal disease: Secondary | ICD-10-CM | POA: Diagnosis not present

## 2017-05-18 DIAGNOSIS — N2581 Secondary hyperparathyroidism of renal origin: Secondary | ICD-10-CM | POA: Diagnosis not present

## 2017-05-18 DIAGNOSIS — E611 Iron deficiency: Secondary | ICD-10-CM | POA: Diagnosis not present

## 2017-05-20 DIAGNOSIS — E611 Iron deficiency: Secondary | ICD-10-CM | POA: Diagnosis not present

## 2017-05-20 DIAGNOSIS — N186 End stage renal disease: Secondary | ICD-10-CM | POA: Diagnosis not present

## 2017-05-20 DIAGNOSIS — D631 Anemia in chronic kidney disease: Secondary | ICD-10-CM | POA: Diagnosis not present

## 2017-05-20 DIAGNOSIS — Z992 Dependence on renal dialysis: Secondary | ICD-10-CM | POA: Diagnosis not present

## 2017-05-20 DIAGNOSIS — N2581 Secondary hyperparathyroidism of renal origin: Secondary | ICD-10-CM | POA: Diagnosis not present

## 2017-05-23 DIAGNOSIS — E611 Iron deficiency: Secondary | ICD-10-CM | POA: Diagnosis not present

## 2017-05-23 DIAGNOSIS — Z992 Dependence on renal dialysis: Secondary | ICD-10-CM | POA: Diagnosis not present

## 2017-05-23 DIAGNOSIS — N186 End stage renal disease: Secondary | ICD-10-CM | POA: Diagnosis not present

## 2017-05-23 DIAGNOSIS — D631 Anemia in chronic kidney disease: Secondary | ICD-10-CM | POA: Diagnosis not present

## 2017-05-23 DIAGNOSIS — N2581 Secondary hyperparathyroidism of renal origin: Secondary | ICD-10-CM | POA: Diagnosis not present

## 2017-05-25 DIAGNOSIS — E611 Iron deficiency: Secondary | ICD-10-CM | POA: Diagnosis not present

## 2017-05-25 DIAGNOSIS — N186 End stage renal disease: Secondary | ICD-10-CM | POA: Diagnosis not present

## 2017-05-25 DIAGNOSIS — N2581 Secondary hyperparathyroidism of renal origin: Secondary | ICD-10-CM | POA: Diagnosis not present

## 2017-05-25 DIAGNOSIS — D631 Anemia in chronic kidney disease: Secondary | ICD-10-CM | POA: Diagnosis not present

## 2017-05-25 DIAGNOSIS — Z992 Dependence on renal dialysis: Secondary | ICD-10-CM | POA: Diagnosis not present

## 2017-05-27 DIAGNOSIS — E611 Iron deficiency: Secondary | ICD-10-CM | POA: Diagnosis not present

## 2017-05-27 DIAGNOSIS — D631 Anemia in chronic kidney disease: Secondary | ICD-10-CM | POA: Diagnosis not present

## 2017-05-27 DIAGNOSIS — Z992 Dependence on renal dialysis: Secondary | ICD-10-CM | POA: Diagnosis not present

## 2017-05-27 DIAGNOSIS — N186 End stage renal disease: Secondary | ICD-10-CM | POA: Diagnosis not present

## 2017-05-27 DIAGNOSIS — N2581 Secondary hyperparathyroidism of renal origin: Secondary | ICD-10-CM | POA: Diagnosis not present

## 2017-05-30 DIAGNOSIS — D631 Anemia in chronic kidney disease: Secondary | ICD-10-CM | POA: Diagnosis not present

## 2017-05-30 DIAGNOSIS — Z992 Dependence on renal dialysis: Secondary | ICD-10-CM | POA: Diagnosis not present

## 2017-05-30 DIAGNOSIS — N186 End stage renal disease: Secondary | ICD-10-CM | POA: Diagnosis not present

## 2017-05-30 DIAGNOSIS — N2581 Secondary hyperparathyroidism of renal origin: Secondary | ICD-10-CM | POA: Diagnosis not present

## 2017-05-30 DIAGNOSIS — E611 Iron deficiency: Secondary | ICD-10-CM | POA: Diagnosis not present

## 2017-06-01 DIAGNOSIS — N186 End stage renal disease: Secondary | ICD-10-CM | POA: Diagnosis not present

## 2017-06-01 DIAGNOSIS — E611 Iron deficiency: Secondary | ICD-10-CM | POA: Diagnosis not present

## 2017-06-01 DIAGNOSIS — D631 Anemia in chronic kidney disease: Secondary | ICD-10-CM | POA: Diagnosis not present

## 2017-06-01 DIAGNOSIS — Z992 Dependence on renal dialysis: Secondary | ICD-10-CM | POA: Diagnosis not present

## 2017-06-01 DIAGNOSIS — N2581 Secondary hyperparathyroidism of renal origin: Secondary | ICD-10-CM | POA: Diagnosis not present

## 2017-06-03 DIAGNOSIS — E611 Iron deficiency: Secondary | ICD-10-CM | POA: Diagnosis not present

## 2017-06-03 DIAGNOSIS — N186 End stage renal disease: Secondary | ICD-10-CM | POA: Diagnosis not present

## 2017-06-03 DIAGNOSIS — Z992 Dependence on renal dialysis: Secondary | ICD-10-CM | POA: Diagnosis not present

## 2017-06-03 DIAGNOSIS — N2581 Secondary hyperparathyroidism of renal origin: Secondary | ICD-10-CM | POA: Diagnosis not present

## 2017-06-03 DIAGNOSIS — D631 Anemia in chronic kidney disease: Secondary | ICD-10-CM | POA: Diagnosis not present

## 2017-06-06 DIAGNOSIS — E611 Iron deficiency: Secondary | ICD-10-CM | POA: Diagnosis not present

## 2017-06-06 DIAGNOSIS — N186 End stage renal disease: Secondary | ICD-10-CM | POA: Diagnosis not present

## 2017-06-06 DIAGNOSIS — D631 Anemia in chronic kidney disease: Secondary | ICD-10-CM | POA: Diagnosis not present

## 2017-06-06 DIAGNOSIS — N2581 Secondary hyperparathyroidism of renal origin: Secondary | ICD-10-CM | POA: Diagnosis not present

## 2017-06-06 DIAGNOSIS — Z992 Dependence on renal dialysis: Secondary | ICD-10-CM | POA: Diagnosis not present

## 2017-06-08 DIAGNOSIS — E611 Iron deficiency: Secondary | ICD-10-CM | POA: Diagnosis not present

## 2017-06-08 DIAGNOSIS — N186 End stage renal disease: Secondary | ICD-10-CM | POA: Diagnosis not present

## 2017-06-08 DIAGNOSIS — D631 Anemia in chronic kidney disease: Secondary | ICD-10-CM | POA: Diagnosis not present

## 2017-06-08 DIAGNOSIS — Z992 Dependence on renal dialysis: Secondary | ICD-10-CM | POA: Diagnosis not present

## 2017-06-08 DIAGNOSIS — N2581 Secondary hyperparathyroidism of renal origin: Secondary | ICD-10-CM | POA: Diagnosis not present

## 2017-06-10 DIAGNOSIS — D631 Anemia in chronic kidney disease: Secondary | ICD-10-CM | POA: Diagnosis not present

## 2017-06-10 DIAGNOSIS — Z992 Dependence on renal dialysis: Secondary | ICD-10-CM | POA: Diagnosis not present

## 2017-06-10 DIAGNOSIS — N186 End stage renal disease: Secondary | ICD-10-CM | POA: Diagnosis not present

## 2017-06-10 DIAGNOSIS — E611 Iron deficiency: Secondary | ICD-10-CM | POA: Diagnosis not present

## 2017-06-10 DIAGNOSIS — N2581 Secondary hyperparathyroidism of renal origin: Secondary | ICD-10-CM | POA: Diagnosis not present

## 2017-06-13 DIAGNOSIS — N2581 Secondary hyperparathyroidism of renal origin: Secondary | ICD-10-CM | POA: Diagnosis not present

## 2017-06-13 DIAGNOSIS — Z992 Dependence on renal dialysis: Secondary | ICD-10-CM | POA: Diagnosis not present

## 2017-06-13 DIAGNOSIS — D631 Anemia in chronic kidney disease: Secondary | ICD-10-CM | POA: Diagnosis not present

## 2017-06-13 DIAGNOSIS — E611 Iron deficiency: Secondary | ICD-10-CM | POA: Diagnosis not present

## 2017-06-13 DIAGNOSIS — N186 End stage renal disease: Secondary | ICD-10-CM | POA: Diagnosis not present

## 2017-06-14 DIAGNOSIS — N186 End stage renal disease: Secondary | ICD-10-CM | POA: Diagnosis not present

## 2017-06-14 DIAGNOSIS — Z992 Dependence on renal dialysis: Secondary | ICD-10-CM | POA: Diagnosis not present

## 2017-06-15 DIAGNOSIS — N2581 Secondary hyperparathyroidism of renal origin: Secondary | ICD-10-CM | POA: Diagnosis not present

## 2017-06-15 DIAGNOSIS — Z992 Dependence on renal dialysis: Secondary | ICD-10-CM | POA: Diagnosis not present

## 2017-06-15 DIAGNOSIS — D631 Anemia in chronic kidney disease: Secondary | ICD-10-CM | POA: Diagnosis not present

## 2017-06-15 DIAGNOSIS — N186 End stage renal disease: Secondary | ICD-10-CM | POA: Diagnosis not present

## 2017-06-15 DIAGNOSIS — E611 Iron deficiency: Secondary | ICD-10-CM | POA: Diagnosis not present

## 2017-06-17 DIAGNOSIS — N186 End stage renal disease: Secondary | ICD-10-CM | POA: Diagnosis not present

## 2017-06-17 DIAGNOSIS — Z992 Dependence on renal dialysis: Secondary | ICD-10-CM | POA: Diagnosis not present

## 2017-06-17 DIAGNOSIS — N2581 Secondary hyperparathyroidism of renal origin: Secondary | ICD-10-CM | POA: Diagnosis not present

## 2017-06-17 DIAGNOSIS — D631 Anemia in chronic kidney disease: Secondary | ICD-10-CM | POA: Diagnosis not present

## 2017-06-17 DIAGNOSIS — E611 Iron deficiency: Secondary | ICD-10-CM | POA: Diagnosis not present

## 2017-06-20 DIAGNOSIS — N186 End stage renal disease: Secondary | ICD-10-CM | POA: Diagnosis not present

## 2017-06-20 DIAGNOSIS — E611 Iron deficiency: Secondary | ICD-10-CM | POA: Diagnosis not present

## 2017-06-20 DIAGNOSIS — D631 Anemia in chronic kidney disease: Secondary | ICD-10-CM | POA: Diagnosis not present

## 2017-06-20 DIAGNOSIS — N2581 Secondary hyperparathyroidism of renal origin: Secondary | ICD-10-CM | POA: Diagnosis not present

## 2017-06-20 DIAGNOSIS — Z992 Dependence on renal dialysis: Secondary | ICD-10-CM | POA: Diagnosis not present

## 2017-06-22 DIAGNOSIS — D631 Anemia in chronic kidney disease: Secondary | ICD-10-CM | POA: Diagnosis not present

## 2017-06-22 DIAGNOSIS — N2581 Secondary hyperparathyroidism of renal origin: Secondary | ICD-10-CM | POA: Diagnosis not present

## 2017-06-22 DIAGNOSIS — Z992 Dependence on renal dialysis: Secondary | ICD-10-CM | POA: Diagnosis not present

## 2017-06-22 DIAGNOSIS — E611 Iron deficiency: Secondary | ICD-10-CM | POA: Diagnosis not present

## 2017-06-22 DIAGNOSIS — N186 End stage renal disease: Secondary | ICD-10-CM | POA: Diagnosis not present

## 2017-06-24 DIAGNOSIS — D631 Anemia in chronic kidney disease: Secondary | ICD-10-CM | POA: Diagnosis not present

## 2017-06-24 DIAGNOSIS — N2581 Secondary hyperparathyroidism of renal origin: Secondary | ICD-10-CM | POA: Diagnosis not present

## 2017-06-24 DIAGNOSIS — E611 Iron deficiency: Secondary | ICD-10-CM | POA: Diagnosis not present

## 2017-06-24 DIAGNOSIS — N186 End stage renal disease: Secondary | ICD-10-CM | POA: Diagnosis not present

## 2017-06-24 DIAGNOSIS — Z992 Dependence on renal dialysis: Secondary | ICD-10-CM | POA: Diagnosis not present

## 2017-06-27 DIAGNOSIS — Z992 Dependence on renal dialysis: Secondary | ICD-10-CM | POA: Diagnosis not present

## 2017-06-27 DIAGNOSIS — N186 End stage renal disease: Secondary | ICD-10-CM | POA: Diagnosis not present

## 2017-06-27 DIAGNOSIS — N2581 Secondary hyperparathyroidism of renal origin: Secondary | ICD-10-CM | POA: Diagnosis not present

## 2017-06-27 DIAGNOSIS — E611 Iron deficiency: Secondary | ICD-10-CM | POA: Diagnosis not present

## 2017-06-27 DIAGNOSIS — D631 Anemia in chronic kidney disease: Secondary | ICD-10-CM | POA: Diagnosis not present

## 2017-06-29 DIAGNOSIS — E611 Iron deficiency: Secondary | ICD-10-CM | POA: Diagnosis not present

## 2017-06-29 DIAGNOSIS — N2581 Secondary hyperparathyroidism of renal origin: Secondary | ICD-10-CM | POA: Diagnosis not present

## 2017-06-29 DIAGNOSIS — D631 Anemia in chronic kidney disease: Secondary | ICD-10-CM | POA: Diagnosis not present

## 2017-06-29 DIAGNOSIS — N186 End stage renal disease: Secondary | ICD-10-CM | POA: Diagnosis not present

## 2017-06-29 DIAGNOSIS — Z992 Dependence on renal dialysis: Secondary | ICD-10-CM | POA: Diagnosis not present

## 2017-07-01 DIAGNOSIS — N2581 Secondary hyperparathyroidism of renal origin: Secondary | ICD-10-CM | POA: Diagnosis not present

## 2017-07-01 DIAGNOSIS — E611 Iron deficiency: Secondary | ICD-10-CM | POA: Diagnosis not present

## 2017-07-01 DIAGNOSIS — N186 End stage renal disease: Secondary | ICD-10-CM | POA: Diagnosis not present

## 2017-07-01 DIAGNOSIS — Z992 Dependence on renal dialysis: Secondary | ICD-10-CM | POA: Diagnosis not present

## 2017-07-01 DIAGNOSIS — D631 Anemia in chronic kidney disease: Secondary | ICD-10-CM | POA: Diagnosis not present

## 2017-07-04 DIAGNOSIS — N186 End stage renal disease: Secondary | ICD-10-CM | POA: Diagnosis not present

## 2017-07-04 DIAGNOSIS — N2581 Secondary hyperparathyroidism of renal origin: Secondary | ICD-10-CM | POA: Diagnosis not present

## 2017-07-04 DIAGNOSIS — D631 Anemia in chronic kidney disease: Secondary | ICD-10-CM | POA: Diagnosis not present

## 2017-07-04 DIAGNOSIS — Z992 Dependence on renal dialysis: Secondary | ICD-10-CM | POA: Diagnosis not present

## 2017-07-04 DIAGNOSIS — E611 Iron deficiency: Secondary | ICD-10-CM | POA: Diagnosis not present

## 2017-07-06 DIAGNOSIS — N186 End stage renal disease: Secondary | ICD-10-CM | POA: Diagnosis not present

## 2017-07-06 DIAGNOSIS — D631 Anemia in chronic kidney disease: Secondary | ICD-10-CM | POA: Diagnosis not present

## 2017-07-06 DIAGNOSIS — N2581 Secondary hyperparathyroidism of renal origin: Secondary | ICD-10-CM | POA: Diagnosis not present

## 2017-07-06 DIAGNOSIS — Z992 Dependence on renal dialysis: Secondary | ICD-10-CM | POA: Diagnosis not present

## 2017-07-06 DIAGNOSIS — E611 Iron deficiency: Secondary | ICD-10-CM | POA: Diagnosis not present

## 2017-07-08 DIAGNOSIS — N2581 Secondary hyperparathyroidism of renal origin: Secondary | ICD-10-CM | POA: Diagnosis not present

## 2017-07-08 DIAGNOSIS — Z992 Dependence on renal dialysis: Secondary | ICD-10-CM | POA: Diagnosis not present

## 2017-07-08 DIAGNOSIS — D631 Anemia in chronic kidney disease: Secondary | ICD-10-CM | POA: Diagnosis not present

## 2017-07-08 DIAGNOSIS — E611 Iron deficiency: Secondary | ICD-10-CM | POA: Diagnosis not present

## 2017-07-08 DIAGNOSIS — N186 End stage renal disease: Secondary | ICD-10-CM | POA: Diagnosis not present

## 2017-07-11 DIAGNOSIS — D631 Anemia in chronic kidney disease: Secondary | ICD-10-CM | POA: Diagnosis not present

## 2017-07-11 DIAGNOSIS — E611 Iron deficiency: Secondary | ICD-10-CM | POA: Diagnosis not present

## 2017-07-11 DIAGNOSIS — Z992 Dependence on renal dialysis: Secondary | ICD-10-CM | POA: Diagnosis not present

## 2017-07-11 DIAGNOSIS — N186 End stage renal disease: Secondary | ICD-10-CM | POA: Diagnosis not present

## 2017-07-11 DIAGNOSIS — N2581 Secondary hyperparathyroidism of renal origin: Secondary | ICD-10-CM | POA: Diagnosis not present

## 2017-07-13 DIAGNOSIS — N186 End stage renal disease: Secondary | ICD-10-CM | POA: Diagnosis not present

## 2017-07-13 DIAGNOSIS — Z992 Dependence on renal dialysis: Secondary | ICD-10-CM | POA: Diagnosis not present

## 2017-07-13 DIAGNOSIS — E611 Iron deficiency: Secondary | ICD-10-CM | POA: Diagnosis not present

## 2017-07-13 DIAGNOSIS — D631 Anemia in chronic kidney disease: Secondary | ICD-10-CM | POA: Diagnosis not present

## 2017-07-13 DIAGNOSIS — N2581 Secondary hyperparathyroidism of renal origin: Secondary | ICD-10-CM | POA: Diagnosis not present

## 2017-07-15 DIAGNOSIS — Z992 Dependence on renal dialysis: Secondary | ICD-10-CM | POA: Diagnosis not present

## 2017-07-15 DIAGNOSIS — N186 End stage renal disease: Secondary | ICD-10-CM | POA: Diagnosis not present

## 2017-07-15 DIAGNOSIS — N2581 Secondary hyperparathyroidism of renal origin: Secondary | ICD-10-CM | POA: Diagnosis not present

## 2017-07-15 DIAGNOSIS — D631 Anemia in chronic kidney disease: Secondary | ICD-10-CM | POA: Diagnosis not present

## 2017-07-15 DIAGNOSIS — E611 Iron deficiency: Secondary | ICD-10-CM | POA: Diagnosis not present

## 2017-07-18 DIAGNOSIS — E611 Iron deficiency: Secondary | ICD-10-CM | POA: Diagnosis not present

## 2017-07-18 DIAGNOSIS — D509 Iron deficiency anemia, unspecified: Secondary | ICD-10-CM | POA: Diagnosis not present

## 2017-07-18 DIAGNOSIS — N186 End stage renal disease: Secondary | ICD-10-CM | POA: Diagnosis not present

## 2017-07-18 DIAGNOSIS — N2581 Secondary hyperparathyroidism of renal origin: Secondary | ICD-10-CM | POA: Diagnosis not present

## 2017-07-18 DIAGNOSIS — Z992 Dependence on renal dialysis: Secondary | ICD-10-CM | POA: Diagnosis not present

## 2017-07-18 DIAGNOSIS — D631 Anemia in chronic kidney disease: Secondary | ICD-10-CM | POA: Diagnosis not present

## 2017-07-20 DIAGNOSIS — N2581 Secondary hyperparathyroidism of renal origin: Secondary | ICD-10-CM | POA: Diagnosis not present

## 2017-07-20 DIAGNOSIS — D631 Anemia in chronic kidney disease: Secondary | ICD-10-CM | POA: Diagnosis not present

## 2017-07-20 DIAGNOSIS — Z992 Dependence on renal dialysis: Secondary | ICD-10-CM | POA: Diagnosis not present

## 2017-07-20 DIAGNOSIS — E611 Iron deficiency: Secondary | ICD-10-CM | POA: Diagnosis not present

## 2017-07-20 DIAGNOSIS — N186 End stage renal disease: Secondary | ICD-10-CM | POA: Diagnosis not present

## 2017-07-20 DIAGNOSIS — D509 Iron deficiency anemia, unspecified: Secondary | ICD-10-CM | POA: Diagnosis not present

## 2017-07-22 DIAGNOSIS — N186 End stage renal disease: Secondary | ICD-10-CM | POA: Diagnosis not present

## 2017-07-22 DIAGNOSIS — Z992 Dependence on renal dialysis: Secondary | ICD-10-CM | POA: Diagnosis not present

## 2017-07-22 DIAGNOSIS — D631 Anemia in chronic kidney disease: Secondary | ICD-10-CM | POA: Diagnosis not present

## 2017-07-22 DIAGNOSIS — N2581 Secondary hyperparathyroidism of renal origin: Secondary | ICD-10-CM | POA: Diagnosis not present

## 2017-07-22 DIAGNOSIS — E611 Iron deficiency: Secondary | ICD-10-CM | POA: Diagnosis not present

## 2017-07-22 DIAGNOSIS — D509 Iron deficiency anemia, unspecified: Secondary | ICD-10-CM | POA: Diagnosis not present

## 2017-07-25 DIAGNOSIS — D631 Anemia in chronic kidney disease: Secondary | ICD-10-CM | POA: Diagnosis not present

## 2017-07-25 DIAGNOSIS — D509 Iron deficiency anemia, unspecified: Secondary | ICD-10-CM | POA: Diagnosis not present

## 2017-07-25 DIAGNOSIS — E611 Iron deficiency: Secondary | ICD-10-CM | POA: Diagnosis not present

## 2017-07-25 DIAGNOSIS — N2581 Secondary hyperparathyroidism of renal origin: Secondary | ICD-10-CM | POA: Diagnosis not present

## 2017-07-25 DIAGNOSIS — Z992 Dependence on renal dialysis: Secondary | ICD-10-CM | POA: Diagnosis not present

## 2017-07-25 DIAGNOSIS — N186 End stage renal disease: Secondary | ICD-10-CM | POA: Diagnosis not present

## 2017-07-27 DIAGNOSIS — N186 End stage renal disease: Secondary | ICD-10-CM | POA: Diagnosis not present

## 2017-07-27 DIAGNOSIS — E611 Iron deficiency: Secondary | ICD-10-CM | POA: Diagnosis not present

## 2017-07-27 DIAGNOSIS — Z992 Dependence on renal dialysis: Secondary | ICD-10-CM | POA: Diagnosis not present

## 2017-07-27 DIAGNOSIS — N2581 Secondary hyperparathyroidism of renal origin: Secondary | ICD-10-CM | POA: Diagnosis not present

## 2017-07-27 DIAGNOSIS — D509 Iron deficiency anemia, unspecified: Secondary | ICD-10-CM | POA: Diagnosis not present

## 2017-07-27 DIAGNOSIS — D631 Anemia in chronic kidney disease: Secondary | ICD-10-CM | POA: Diagnosis not present

## 2017-07-29 DIAGNOSIS — N2581 Secondary hyperparathyroidism of renal origin: Secondary | ICD-10-CM | POA: Diagnosis not present

## 2017-07-29 DIAGNOSIS — E611 Iron deficiency: Secondary | ICD-10-CM | POA: Diagnosis not present

## 2017-07-29 DIAGNOSIS — D631 Anemia in chronic kidney disease: Secondary | ICD-10-CM | POA: Diagnosis not present

## 2017-07-29 DIAGNOSIS — Z992 Dependence on renal dialysis: Secondary | ICD-10-CM | POA: Diagnosis not present

## 2017-07-29 DIAGNOSIS — N186 End stage renal disease: Secondary | ICD-10-CM | POA: Diagnosis not present

## 2017-07-29 DIAGNOSIS — D509 Iron deficiency anemia, unspecified: Secondary | ICD-10-CM | POA: Diagnosis not present

## 2017-08-01 DIAGNOSIS — N2581 Secondary hyperparathyroidism of renal origin: Secondary | ICD-10-CM | POA: Diagnosis not present

## 2017-08-01 DIAGNOSIS — Z992 Dependence on renal dialysis: Secondary | ICD-10-CM | POA: Diagnosis not present

## 2017-08-01 DIAGNOSIS — D631 Anemia in chronic kidney disease: Secondary | ICD-10-CM | POA: Diagnosis not present

## 2017-08-01 DIAGNOSIS — N186 End stage renal disease: Secondary | ICD-10-CM | POA: Diagnosis not present

## 2017-08-01 DIAGNOSIS — E611 Iron deficiency: Secondary | ICD-10-CM | POA: Diagnosis not present

## 2017-08-01 DIAGNOSIS — D509 Iron deficiency anemia, unspecified: Secondary | ICD-10-CM | POA: Diagnosis not present

## 2017-08-03 DIAGNOSIS — N2581 Secondary hyperparathyroidism of renal origin: Secondary | ICD-10-CM | POA: Diagnosis not present

## 2017-08-03 DIAGNOSIS — D509 Iron deficiency anemia, unspecified: Secondary | ICD-10-CM | POA: Diagnosis not present

## 2017-08-03 DIAGNOSIS — N186 End stage renal disease: Secondary | ICD-10-CM | POA: Diagnosis not present

## 2017-08-03 DIAGNOSIS — Z992 Dependence on renal dialysis: Secondary | ICD-10-CM | POA: Diagnosis not present

## 2017-08-03 DIAGNOSIS — E611 Iron deficiency: Secondary | ICD-10-CM | POA: Diagnosis not present

## 2017-08-03 DIAGNOSIS — D631 Anemia in chronic kidney disease: Secondary | ICD-10-CM | POA: Diagnosis not present

## 2017-08-05 DIAGNOSIS — D631 Anemia in chronic kidney disease: Secondary | ICD-10-CM | POA: Diagnosis not present

## 2017-08-05 DIAGNOSIS — N186 End stage renal disease: Secondary | ICD-10-CM | POA: Diagnosis not present

## 2017-08-05 DIAGNOSIS — E611 Iron deficiency: Secondary | ICD-10-CM | POA: Diagnosis not present

## 2017-08-05 DIAGNOSIS — N2581 Secondary hyperparathyroidism of renal origin: Secondary | ICD-10-CM | POA: Diagnosis not present

## 2017-08-05 DIAGNOSIS — D509 Iron deficiency anemia, unspecified: Secondary | ICD-10-CM | POA: Diagnosis not present

## 2017-08-05 DIAGNOSIS — Z992 Dependence on renal dialysis: Secondary | ICD-10-CM | POA: Diagnosis not present

## 2017-08-08 DIAGNOSIS — N2581 Secondary hyperparathyroidism of renal origin: Secondary | ICD-10-CM | POA: Diagnosis not present

## 2017-08-08 DIAGNOSIS — Z992 Dependence on renal dialysis: Secondary | ICD-10-CM | POA: Diagnosis not present

## 2017-08-08 DIAGNOSIS — D631 Anemia in chronic kidney disease: Secondary | ICD-10-CM | POA: Diagnosis not present

## 2017-08-08 DIAGNOSIS — E611 Iron deficiency: Secondary | ICD-10-CM | POA: Diagnosis not present

## 2017-08-08 DIAGNOSIS — N186 End stage renal disease: Secondary | ICD-10-CM | POA: Diagnosis not present

## 2017-08-08 DIAGNOSIS — D509 Iron deficiency anemia, unspecified: Secondary | ICD-10-CM | POA: Diagnosis not present

## 2017-08-10 DIAGNOSIS — D631 Anemia in chronic kidney disease: Secondary | ICD-10-CM | POA: Diagnosis not present

## 2017-08-10 DIAGNOSIS — N2581 Secondary hyperparathyroidism of renal origin: Secondary | ICD-10-CM | POA: Diagnosis not present

## 2017-08-10 DIAGNOSIS — Z992 Dependence on renal dialysis: Secondary | ICD-10-CM | POA: Diagnosis not present

## 2017-08-10 DIAGNOSIS — D509 Iron deficiency anemia, unspecified: Secondary | ICD-10-CM | POA: Diagnosis not present

## 2017-08-10 DIAGNOSIS — N186 End stage renal disease: Secondary | ICD-10-CM | POA: Diagnosis not present

## 2017-08-10 DIAGNOSIS — E611 Iron deficiency: Secondary | ICD-10-CM | POA: Diagnosis not present

## 2017-08-12 DIAGNOSIS — N186 End stage renal disease: Secondary | ICD-10-CM | POA: Diagnosis not present

## 2017-08-12 DIAGNOSIS — E611 Iron deficiency: Secondary | ICD-10-CM | POA: Diagnosis not present

## 2017-08-12 DIAGNOSIS — D509 Iron deficiency anemia, unspecified: Secondary | ICD-10-CM | POA: Diagnosis not present

## 2017-08-12 DIAGNOSIS — Z992 Dependence on renal dialysis: Secondary | ICD-10-CM | POA: Diagnosis not present

## 2017-08-12 DIAGNOSIS — D631 Anemia in chronic kidney disease: Secondary | ICD-10-CM | POA: Diagnosis not present

## 2017-08-12 DIAGNOSIS — N2581 Secondary hyperparathyroidism of renal origin: Secondary | ICD-10-CM | POA: Diagnosis not present

## 2017-08-14 DIAGNOSIS — Z992 Dependence on renal dialysis: Secondary | ICD-10-CM | POA: Diagnosis not present

## 2017-08-14 DIAGNOSIS — N186 End stage renal disease: Secondary | ICD-10-CM | POA: Diagnosis not present

## 2017-08-15 DIAGNOSIS — Z23 Encounter for immunization: Secondary | ICD-10-CM | POA: Diagnosis not present

## 2017-08-15 DIAGNOSIS — N2581 Secondary hyperparathyroidism of renal origin: Secondary | ICD-10-CM | POA: Diagnosis not present

## 2017-08-15 DIAGNOSIS — Z992 Dependence on renal dialysis: Secondary | ICD-10-CM | POA: Diagnosis not present

## 2017-08-15 DIAGNOSIS — D631 Anemia in chronic kidney disease: Secondary | ICD-10-CM | POA: Diagnosis not present

## 2017-08-15 DIAGNOSIS — E611 Iron deficiency: Secondary | ICD-10-CM | POA: Diagnosis not present

## 2017-08-15 DIAGNOSIS — N186 End stage renal disease: Secondary | ICD-10-CM | POA: Diagnosis not present

## 2017-08-17 DIAGNOSIS — D631 Anemia in chronic kidney disease: Secondary | ICD-10-CM | POA: Diagnosis not present

## 2017-08-17 DIAGNOSIS — N186 End stage renal disease: Secondary | ICD-10-CM | POA: Diagnosis not present

## 2017-08-17 DIAGNOSIS — E611 Iron deficiency: Secondary | ICD-10-CM | POA: Diagnosis not present

## 2017-08-17 DIAGNOSIS — Z23 Encounter for immunization: Secondary | ICD-10-CM | POA: Diagnosis not present

## 2017-08-17 DIAGNOSIS — N2581 Secondary hyperparathyroidism of renal origin: Secondary | ICD-10-CM | POA: Diagnosis not present

## 2017-08-17 DIAGNOSIS — Z992 Dependence on renal dialysis: Secondary | ICD-10-CM | POA: Diagnosis not present

## 2017-08-19 DIAGNOSIS — N2581 Secondary hyperparathyroidism of renal origin: Secondary | ICD-10-CM | POA: Diagnosis not present

## 2017-08-19 DIAGNOSIS — E611 Iron deficiency: Secondary | ICD-10-CM | POA: Diagnosis not present

## 2017-08-19 DIAGNOSIS — D631 Anemia in chronic kidney disease: Secondary | ICD-10-CM | POA: Diagnosis not present

## 2017-08-19 DIAGNOSIS — Z23 Encounter for immunization: Secondary | ICD-10-CM | POA: Diagnosis not present

## 2017-08-19 DIAGNOSIS — Z992 Dependence on renal dialysis: Secondary | ICD-10-CM | POA: Diagnosis not present

## 2017-08-19 DIAGNOSIS — N186 End stage renal disease: Secondary | ICD-10-CM | POA: Diagnosis not present

## 2017-08-22 DIAGNOSIS — D631 Anemia in chronic kidney disease: Secondary | ICD-10-CM | POA: Diagnosis not present

## 2017-08-22 DIAGNOSIS — Z992 Dependence on renal dialysis: Secondary | ICD-10-CM | POA: Diagnosis not present

## 2017-08-22 DIAGNOSIS — N2581 Secondary hyperparathyroidism of renal origin: Secondary | ICD-10-CM | POA: Diagnosis not present

## 2017-08-22 DIAGNOSIS — Z23 Encounter for immunization: Secondary | ICD-10-CM | POA: Diagnosis not present

## 2017-08-22 DIAGNOSIS — N186 End stage renal disease: Secondary | ICD-10-CM | POA: Diagnosis not present

## 2017-08-22 DIAGNOSIS — E611 Iron deficiency: Secondary | ICD-10-CM | POA: Diagnosis not present

## 2017-08-24 DIAGNOSIS — Z23 Encounter for immunization: Secondary | ICD-10-CM | POA: Diagnosis not present

## 2017-08-24 DIAGNOSIS — Z992 Dependence on renal dialysis: Secondary | ICD-10-CM | POA: Diagnosis not present

## 2017-08-24 DIAGNOSIS — N2581 Secondary hyperparathyroidism of renal origin: Secondary | ICD-10-CM | POA: Diagnosis not present

## 2017-08-24 DIAGNOSIS — D631 Anemia in chronic kidney disease: Secondary | ICD-10-CM | POA: Diagnosis not present

## 2017-08-24 DIAGNOSIS — E611 Iron deficiency: Secondary | ICD-10-CM | POA: Diagnosis not present

## 2017-08-24 DIAGNOSIS — N186 End stage renal disease: Secondary | ICD-10-CM | POA: Diagnosis not present

## 2017-08-26 DIAGNOSIS — E611 Iron deficiency: Secondary | ICD-10-CM | POA: Diagnosis not present

## 2017-08-26 DIAGNOSIS — Z992 Dependence on renal dialysis: Secondary | ICD-10-CM | POA: Diagnosis not present

## 2017-08-26 DIAGNOSIS — D631 Anemia in chronic kidney disease: Secondary | ICD-10-CM | POA: Diagnosis not present

## 2017-08-26 DIAGNOSIS — N2581 Secondary hyperparathyroidism of renal origin: Secondary | ICD-10-CM | POA: Diagnosis not present

## 2017-08-26 DIAGNOSIS — Z23 Encounter for immunization: Secondary | ICD-10-CM | POA: Diagnosis not present

## 2017-08-26 DIAGNOSIS — N186 End stage renal disease: Secondary | ICD-10-CM | POA: Diagnosis not present

## 2017-08-29 DIAGNOSIS — N2581 Secondary hyperparathyroidism of renal origin: Secondary | ICD-10-CM | POA: Diagnosis not present

## 2017-08-29 DIAGNOSIS — D631 Anemia in chronic kidney disease: Secondary | ICD-10-CM | POA: Diagnosis not present

## 2017-08-29 DIAGNOSIS — E611 Iron deficiency: Secondary | ICD-10-CM | POA: Diagnosis not present

## 2017-08-29 DIAGNOSIS — N186 End stage renal disease: Secondary | ICD-10-CM | POA: Diagnosis not present

## 2017-08-29 DIAGNOSIS — Z23 Encounter for immunization: Secondary | ICD-10-CM | POA: Diagnosis not present

## 2017-08-29 DIAGNOSIS — Z992 Dependence on renal dialysis: Secondary | ICD-10-CM | POA: Diagnosis not present

## 2017-08-30 DIAGNOSIS — Z1231 Encounter for screening mammogram for malignant neoplasm of breast: Secondary | ICD-10-CM | POA: Diagnosis not present

## 2017-08-31 DIAGNOSIS — E611 Iron deficiency: Secondary | ICD-10-CM | POA: Diagnosis not present

## 2017-08-31 DIAGNOSIS — Z23 Encounter for immunization: Secondary | ICD-10-CM | POA: Diagnosis not present

## 2017-08-31 DIAGNOSIS — N186 End stage renal disease: Secondary | ICD-10-CM | POA: Diagnosis not present

## 2017-08-31 DIAGNOSIS — N2581 Secondary hyperparathyroidism of renal origin: Secondary | ICD-10-CM | POA: Diagnosis not present

## 2017-08-31 DIAGNOSIS — D631 Anemia in chronic kidney disease: Secondary | ICD-10-CM | POA: Diagnosis not present

## 2017-08-31 DIAGNOSIS — Z992 Dependence on renal dialysis: Secondary | ICD-10-CM | POA: Diagnosis not present

## 2017-09-02 DIAGNOSIS — E611 Iron deficiency: Secondary | ICD-10-CM | POA: Diagnosis not present

## 2017-09-02 DIAGNOSIS — N2581 Secondary hyperparathyroidism of renal origin: Secondary | ICD-10-CM | POA: Diagnosis not present

## 2017-09-02 DIAGNOSIS — N186 End stage renal disease: Secondary | ICD-10-CM | POA: Diagnosis not present

## 2017-09-02 DIAGNOSIS — Z23 Encounter for immunization: Secondary | ICD-10-CM | POA: Diagnosis not present

## 2017-09-02 DIAGNOSIS — D631 Anemia in chronic kidney disease: Secondary | ICD-10-CM | POA: Diagnosis not present

## 2017-09-02 DIAGNOSIS — Z992 Dependence on renal dialysis: Secondary | ICD-10-CM | POA: Diagnosis not present

## 2017-09-05 DIAGNOSIS — Z992 Dependence on renal dialysis: Secondary | ICD-10-CM | POA: Diagnosis not present

## 2017-09-05 DIAGNOSIS — D631 Anemia in chronic kidney disease: Secondary | ICD-10-CM | POA: Diagnosis not present

## 2017-09-05 DIAGNOSIS — N186 End stage renal disease: Secondary | ICD-10-CM | POA: Diagnosis not present

## 2017-09-05 DIAGNOSIS — Z23 Encounter for immunization: Secondary | ICD-10-CM | POA: Diagnosis not present

## 2017-09-05 DIAGNOSIS — E611 Iron deficiency: Secondary | ICD-10-CM | POA: Diagnosis not present

## 2017-09-05 DIAGNOSIS — N2581 Secondary hyperparathyroidism of renal origin: Secondary | ICD-10-CM | POA: Diagnosis not present

## 2017-09-07 DIAGNOSIS — N186 End stage renal disease: Secondary | ICD-10-CM | POA: Diagnosis not present

## 2017-09-07 DIAGNOSIS — E611 Iron deficiency: Secondary | ICD-10-CM | POA: Diagnosis not present

## 2017-09-07 DIAGNOSIS — N2581 Secondary hyperparathyroidism of renal origin: Secondary | ICD-10-CM | POA: Diagnosis not present

## 2017-09-07 DIAGNOSIS — D631 Anemia in chronic kidney disease: Secondary | ICD-10-CM | POA: Diagnosis not present

## 2017-09-07 DIAGNOSIS — Z23 Encounter for immunization: Secondary | ICD-10-CM | POA: Diagnosis not present

## 2017-09-07 DIAGNOSIS — Z992 Dependence on renal dialysis: Secondary | ICD-10-CM | POA: Diagnosis not present

## 2017-09-09 DIAGNOSIS — Z23 Encounter for immunization: Secondary | ICD-10-CM | POA: Diagnosis not present

## 2017-09-09 DIAGNOSIS — N2581 Secondary hyperparathyroidism of renal origin: Secondary | ICD-10-CM | POA: Diagnosis not present

## 2017-09-09 DIAGNOSIS — N186 End stage renal disease: Secondary | ICD-10-CM | POA: Diagnosis not present

## 2017-09-09 DIAGNOSIS — D631 Anemia in chronic kidney disease: Secondary | ICD-10-CM | POA: Diagnosis not present

## 2017-09-09 DIAGNOSIS — Z992 Dependence on renal dialysis: Secondary | ICD-10-CM | POA: Diagnosis not present

## 2017-09-09 DIAGNOSIS — E611 Iron deficiency: Secondary | ICD-10-CM | POA: Diagnosis not present

## 2017-09-12 DIAGNOSIS — D631 Anemia in chronic kidney disease: Secondary | ICD-10-CM | POA: Diagnosis not present

## 2017-09-12 DIAGNOSIS — N186 End stage renal disease: Secondary | ICD-10-CM | POA: Diagnosis not present

## 2017-09-12 DIAGNOSIS — Z992 Dependence on renal dialysis: Secondary | ICD-10-CM | POA: Diagnosis not present

## 2017-09-12 DIAGNOSIS — N2581 Secondary hyperparathyroidism of renal origin: Secondary | ICD-10-CM | POA: Diagnosis not present

## 2017-09-12 DIAGNOSIS — Z23 Encounter for immunization: Secondary | ICD-10-CM | POA: Diagnosis not present

## 2017-09-12 DIAGNOSIS — E611 Iron deficiency: Secondary | ICD-10-CM | POA: Diagnosis not present

## 2017-09-13 DIAGNOSIS — N186 End stage renal disease: Secondary | ICD-10-CM | POA: Diagnosis not present

## 2017-09-13 DIAGNOSIS — Z992 Dependence on renal dialysis: Secondary | ICD-10-CM | POA: Diagnosis not present

## 2017-09-14 DIAGNOSIS — N186 End stage renal disease: Secondary | ICD-10-CM | POA: Diagnosis not present

## 2017-09-14 DIAGNOSIS — E611 Iron deficiency: Secondary | ICD-10-CM | POA: Diagnosis not present

## 2017-09-14 DIAGNOSIS — N2581 Secondary hyperparathyroidism of renal origin: Secondary | ICD-10-CM | POA: Diagnosis not present

## 2017-09-14 DIAGNOSIS — D631 Anemia in chronic kidney disease: Secondary | ICD-10-CM | POA: Diagnosis not present

## 2017-09-14 DIAGNOSIS — Z992 Dependence on renal dialysis: Secondary | ICD-10-CM | POA: Diagnosis not present

## 2017-09-14 DIAGNOSIS — Z23 Encounter for immunization: Secondary | ICD-10-CM | POA: Diagnosis not present

## 2017-09-16 DIAGNOSIS — N186 End stage renal disease: Secondary | ICD-10-CM | POA: Diagnosis not present

## 2017-09-16 DIAGNOSIS — D631 Anemia in chronic kidney disease: Secondary | ICD-10-CM | POA: Diagnosis not present

## 2017-09-16 DIAGNOSIS — Z992 Dependence on renal dialysis: Secondary | ICD-10-CM | POA: Diagnosis not present

## 2017-09-16 DIAGNOSIS — E611 Iron deficiency: Secondary | ICD-10-CM | POA: Diagnosis not present

## 2017-09-16 DIAGNOSIS — N2581 Secondary hyperparathyroidism of renal origin: Secondary | ICD-10-CM | POA: Diagnosis not present

## 2017-09-19 DIAGNOSIS — D631 Anemia in chronic kidney disease: Secondary | ICD-10-CM | POA: Diagnosis not present

## 2017-09-19 DIAGNOSIS — E611 Iron deficiency: Secondary | ICD-10-CM | POA: Diagnosis not present

## 2017-09-19 DIAGNOSIS — N186 End stage renal disease: Secondary | ICD-10-CM | POA: Diagnosis not present

## 2017-09-19 DIAGNOSIS — Z992 Dependence on renal dialysis: Secondary | ICD-10-CM | POA: Diagnosis not present

## 2017-09-19 DIAGNOSIS — N2581 Secondary hyperparathyroidism of renal origin: Secondary | ICD-10-CM | POA: Diagnosis not present

## 2017-09-21 DIAGNOSIS — N2581 Secondary hyperparathyroidism of renal origin: Secondary | ICD-10-CM | POA: Diagnosis not present

## 2017-09-21 DIAGNOSIS — N186 End stage renal disease: Secondary | ICD-10-CM | POA: Diagnosis not present

## 2017-09-21 DIAGNOSIS — D631 Anemia in chronic kidney disease: Secondary | ICD-10-CM | POA: Diagnosis not present

## 2017-09-21 DIAGNOSIS — E611 Iron deficiency: Secondary | ICD-10-CM | POA: Diagnosis not present

## 2017-09-21 DIAGNOSIS — Z992 Dependence on renal dialysis: Secondary | ICD-10-CM | POA: Diagnosis not present

## 2017-09-23 DIAGNOSIS — N186 End stage renal disease: Secondary | ICD-10-CM | POA: Diagnosis not present

## 2017-09-23 DIAGNOSIS — Z992 Dependence on renal dialysis: Secondary | ICD-10-CM | POA: Diagnosis not present

## 2017-09-23 DIAGNOSIS — N2581 Secondary hyperparathyroidism of renal origin: Secondary | ICD-10-CM | POA: Diagnosis not present

## 2017-09-23 DIAGNOSIS — D631 Anemia in chronic kidney disease: Secondary | ICD-10-CM | POA: Diagnosis not present

## 2017-09-23 DIAGNOSIS — E611 Iron deficiency: Secondary | ICD-10-CM | POA: Diagnosis not present

## 2017-09-26 DIAGNOSIS — N2581 Secondary hyperparathyroidism of renal origin: Secondary | ICD-10-CM | POA: Diagnosis not present

## 2017-09-26 DIAGNOSIS — E611 Iron deficiency: Secondary | ICD-10-CM | POA: Diagnosis not present

## 2017-09-26 DIAGNOSIS — D631 Anemia in chronic kidney disease: Secondary | ICD-10-CM | POA: Diagnosis not present

## 2017-09-26 DIAGNOSIS — Z992 Dependence on renal dialysis: Secondary | ICD-10-CM | POA: Diagnosis not present

## 2017-09-26 DIAGNOSIS — N186 End stage renal disease: Secondary | ICD-10-CM | POA: Diagnosis not present

## 2017-09-28 DIAGNOSIS — D631 Anemia in chronic kidney disease: Secondary | ICD-10-CM | POA: Diagnosis not present

## 2017-09-28 DIAGNOSIS — N186 End stage renal disease: Secondary | ICD-10-CM | POA: Diagnosis not present

## 2017-09-28 DIAGNOSIS — E611 Iron deficiency: Secondary | ICD-10-CM | POA: Diagnosis not present

## 2017-09-28 DIAGNOSIS — Z992 Dependence on renal dialysis: Secondary | ICD-10-CM | POA: Diagnosis not present

## 2017-09-28 DIAGNOSIS — N2581 Secondary hyperparathyroidism of renal origin: Secondary | ICD-10-CM | POA: Diagnosis not present

## 2017-09-30 DIAGNOSIS — D631 Anemia in chronic kidney disease: Secondary | ICD-10-CM | POA: Diagnosis not present

## 2017-09-30 DIAGNOSIS — N186 End stage renal disease: Secondary | ICD-10-CM | POA: Diagnosis not present

## 2017-09-30 DIAGNOSIS — Z992 Dependence on renal dialysis: Secondary | ICD-10-CM | POA: Diagnosis not present

## 2017-09-30 DIAGNOSIS — E611 Iron deficiency: Secondary | ICD-10-CM | POA: Diagnosis not present

## 2017-09-30 DIAGNOSIS — N2581 Secondary hyperparathyroidism of renal origin: Secondary | ICD-10-CM | POA: Diagnosis not present

## 2017-10-03 DIAGNOSIS — D631 Anemia in chronic kidney disease: Secondary | ICD-10-CM | POA: Diagnosis not present

## 2017-10-03 DIAGNOSIS — N186 End stage renal disease: Secondary | ICD-10-CM | POA: Diagnosis not present

## 2017-10-03 DIAGNOSIS — N2581 Secondary hyperparathyroidism of renal origin: Secondary | ICD-10-CM | POA: Diagnosis not present

## 2017-10-03 DIAGNOSIS — Z992 Dependence on renal dialysis: Secondary | ICD-10-CM | POA: Diagnosis not present

## 2017-10-03 DIAGNOSIS — E611 Iron deficiency: Secondary | ICD-10-CM | POA: Diagnosis not present

## 2017-10-05 DIAGNOSIS — N2581 Secondary hyperparathyroidism of renal origin: Secondary | ICD-10-CM | POA: Diagnosis not present

## 2017-10-05 DIAGNOSIS — Z992 Dependence on renal dialysis: Secondary | ICD-10-CM | POA: Diagnosis not present

## 2017-10-05 DIAGNOSIS — N186 End stage renal disease: Secondary | ICD-10-CM | POA: Diagnosis not present

## 2017-10-05 DIAGNOSIS — E611 Iron deficiency: Secondary | ICD-10-CM | POA: Diagnosis not present

## 2017-10-05 DIAGNOSIS — D631 Anemia in chronic kidney disease: Secondary | ICD-10-CM | POA: Diagnosis not present

## 2017-10-07 DIAGNOSIS — N2581 Secondary hyperparathyroidism of renal origin: Secondary | ICD-10-CM | POA: Diagnosis not present

## 2017-10-07 DIAGNOSIS — N186 End stage renal disease: Secondary | ICD-10-CM | POA: Diagnosis not present

## 2017-10-07 DIAGNOSIS — D631 Anemia in chronic kidney disease: Secondary | ICD-10-CM | POA: Diagnosis not present

## 2017-10-07 DIAGNOSIS — E611 Iron deficiency: Secondary | ICD-10-CM | POA: Diagnosis not present

## 2017-10-07 DIAGNOSIS — Z992 Dependence on renal dialysis: Secondary | ICD-10-CM | POA: Diagnosis not present

## 2017-10-10 ENCOUNTER — Other Ambulatory Visit (HOSPITAL_COMMUNITY): Payer: Self-pay | Admitting: Nephrology

## 2017-10-10 DIAGNOSIS — E611 Iron deficiency: Secondary | ICD-10-CM | POA: Diagnosis not present

## 2017-10-10 DIAGNOSIS — N2581 Secondary hyperparathyroidism of renal origin: Secondary | ICD-10-CM | POA: Diagnosis not present

## 2017-10-10 DIAGNOSIS — D631 Anemia in chronic kidney disease: Secondary | ICD-10-CM | POA: Diagnosis not present

## 2017-10-10 DIAGNOSIS — N186 End stage renal disease: Secondary | ICD-10-CM

## 2017-10-10 DIAGNOSIS — Z992 Dependence on renal dialysis: Secondary | ICD-10-CM | POA: Diagnosis not present

## 2017-10-12 ENCOUNTER — Other Ambulatory Visit: Payer: Self-pay | Admitting: Student

## 2017-10-12 ENCOUNTER — Other Ambulatory Visit: Payer: Self-pay | Admitting: Radiology

## 2017-10-12 DIAGNOSIS — D631 Anemia in chronic kidney disease: Secondary | ICD-10-CM | POA: Diagnosis not present

## 2017-10-12 DIAGNOSIS — N2581 Secondary hyperparathyroidism of renal origin: Secondary | ICD-10-CM | POA: Diagnosis not present

## 2017-10-12 DIAGNOSIS — Z992 Dependence on renal dialysis: Secondary | ICD-10-CM | POA: Diagnosis not present

## 2017-10-12 DIAGNOSIS — E611 Iron deficiency: Secondary | ICD-10-CM | POA: Diagnosis not present

## 2017-10-12 DIAGNOSIS — N186 End stage renal disease: Secondary | ICD-10-CM | POA: Diagnosis not present

## 2017-10-13 ENCOUNTER — Ambulatory Visit (HOSPITAL_COMMUNITY)
Admission: RE | Admit: 2017-10-13 | Discharge: 2017-10-13 | Disposition: A | Discharge status: Home / Self Care | Payer: Medicare Other | Source: Ambulatory Visit | Attending: Nephrology | Admitting: Nephrology

## 2017-10-13 ENCOUNTER — Encounter (HOSPITAL_COMMUNITY): Payer: Self-pay | Admitting: Interventional Radiology

## 2017-10-13 ENCOUNTER — Other Ambulatory Visit (HOSPITAL_COMMUNITY): Payer: Self-pay | Admitting: Nephrology

## 2017-10-13 DIAGNOSIS — N186 End stage renal disease: Secondary | ICD-10-CM | POA: Diagnosis not present

## 2017-10-13 DIAGNOSIS — Y832 Surgical operation with anastomosis, bypass or graft as the cause of abnormal reaction of the patient, or of later complication, without mention of misadventure at the time of the procedure: Secondary | ICD-10-CM | POA: Insufficient documentation

## 2017-10-13 DIAGNOSIS — T82858A Stenosis of vascular prosthetic devices, implants and grafts, initial encounter: Secondary | ICD-10-CM | POA: Insufficient documentation

## 2017-10-13 DIAGNOSIS — Z992 Dependence on renal dialysis: Secondary | ICD-10-CM | POA: Insufficient documentation

## 2017-10-13 HISTORY — PX: IR AV DIALY SHUNT INTRO NEEDLE/INTRACATH INITIAL W/PTA/IMG LEFT: IMG6103

## 2017-10-13 MED ORDER — LIDOCAINE HCL (PF) 1 % IJ SOLN
INTRAMUSCULAR | Status: DC | PRN
  Administered 2017-10-13: 5 mL

## 2017-10-13 MED ORDER — IOPAMIDOL (ISOVUE-300) INJECTION 61%
INTRAVENOUS | Status: AC
  Administered 2017-10-13: 110 mL
  Filled 2017-10-13: qty 150

## 2017-10-13 MED ORDER — LIDOCAINE HCL 1 % IJ SOLN
INTRAMUSCULAR | Status: AC
  Filled 2017-10-13: qty 20

## 2017-10-13 NOTE — Procedures (Signed)
Interventional Radiology Procedure Note  Procedure: Fistulogram with PTA of stent edge stenosis to 10 mm and PTA of mid draining vein stenosis to 9 mm.   Complications: None  Estimated Blood Loss: < 25 mL  Recommendations: - Resume dialysis  Signed,  Criselda Peaches, MD

## 2017-10-14 DIAGNOSIS — D631 Anemia in chronic kidney disease: Secondary | ICD-10-CM | POA: Diagnosis not present

## 2017-10-14 DIAGNOSIS — N186 End stage renal disease: Secondary | ICD-10-CM | POA: Diagnosis not present

## 2017-10-14 DIAGNOSIS — N2581 Secondary hyperparathyroidism of renal origin: Secondary | ICD-10-CM | POA: Diagnosis not present

## 2017-10-14 DIAGNOSIS — E611 Iron deficiency: Secondary | ICD-10-CM | POA: Diagnosis not present

## 2017-10-14 DIAGNOSIS — Z992 Dependence on renal dialysis: Secondary | ICD-10-CM | POA: Diagnosis not present

## 2017-10-17 DIAGNOSIS — N186 End stage renal disease: Secondary | ICD-10-CM | POA: Diagnosis not present

## 2017-10-17 DIAGNOSIS — D631 Anemia in chronic kidney disease: Secondary | ICD-10-CM | POA: Diagnosis not present

## 2017-10-17 DIAGNOSIS — N2581 Secondary hyperparathyroidism of renal origin: Secondary | ICD-10-CM | POA: Diagnosis not present

## 2017-10-17 DIAGNOSIS — D509 Iron deficiency anemia, unspecified: Secondary | ICD-10-CM | POA: Diagnosis not present

## 2017-10-17 DIAGNOSIS — E611 Iron deficiency: Secondary | ICD-10-CM | POA: Diagnosis not present

## 2017-10-17 DIAGNOSIS — Z992 Dependence on renal dialysis: Secondary | ICD-10-CM | POA: Diagnosis not present

## 2017-10-19 DIAGNOSIS — Z992 Dependence on renal dialysis: Secondary | ICD-10-CM | POA: Diagnosis not present

## 2017-10-19 DIAGNOSIS — N186 End stage renal disease: Secondary | ICD-10-CM | POA: Diagnosis not present

## 2017-10-19 DIAGNOSIS — D631 Anemia in chronic kidney disease: Secondary | ICD-10-CM | POA: Diagnosis not present

## 2017-10-19 DIAGNOSIS — E611 Iron deficiency: Secondary | ICD-10-CM | POA: Diagnosis not present

## 2017-10-19 DIAGNOSIS — D509 Iron deficiency anemia, unspecified: Secondary | ICD-10-CM | POA: Diagnosis not present

## 2017-10-19 DIAGNOSIS — N2581 Secondary hyperparathyroidism of renal origin: Secondary | ICD-10-CM | POA: Diagnosis not present

## 2017-10-21 DIAGNOSIS — Z992 Dependence on renal dialysis: Secondary | ICD-10-CM | POA: Diagnosis not present

## 2017-10-21 DIAGNOSIS — E611 Iron deficiency: Secondary | ICD-10-CM | POA: Diagnosis not present

## 2017-10-21 DIAGNOSIS — D509 Iron deficiency anemia, unspecified: Secondary | ICD-10-CM | POA: Diagnosis not present

## 2017-10-21 DIAGNOSIS — N186 End stage renal disease: Secondary | ICD-10-CM | POA: Diagnosis not present

## 2017-10-21 DIAGNOSIS — N2581 Secondary hyperparathyroidism of renal origin: Secondary | ICD-10-CM | POA: Diagnosis not present

## 2017-10-21 DIAGNOSIS — D631 Anemia in chronic kidney disease: Secondary | ICD-10-CM | POA: Diagnosis not present

## 2017-10-24 DIAGNOSIS — N2581 Secondary hyperparathyroidism of renal origin: Secondary | ICD-10-CM | POA: Diagnosis not present

## 2017-10-24 DIAGNOSIS — E611 Iron deficiency: Secondary | ICD-10-CM | POA: Diagnosis not present

## 2017-10-24 DIAGNOSIS — Z992 Dependence on renal dialysis: Secondary | ICD-10-CM | POA: Diagnosis not present

## 2017-10-24 DIAGNOSIS — D631 Anemia in chronic kidney disease: Secondary | ICD-10-CM | POA: Diagnosis not present

## 2017-10-24 DIAGNOSIS — N186 End stage renal disease: Secondary | ICD-10-CM | POA: Diagnosis not present

## 2017-10-24 DIAGNOSIS — D509 Iron deficiency anemia, unspecified: Secondary | ICD-10-CM | POA: Diagnosis not present

## 2017-10-26 DIAGNOSIS — E611 Iron deficiency: Secondary | ICD-10-CM | POA: Diagnosis not present

## 2017-10-26 DIAGNOSIS — N2581 Secondary hyperparathyroidism of renal origin: Secondary | ICD-10-CM | POA: Diagnosis not present

## 2017-10-26 DIAGNOSIS — N186 End stage renal disease: Secondary | ICD-10-CM | POA: Diagnosis not present

## 2017-10-26 DIAGNOSIS — Z992 Dependence on renal dialysis: Secondary | ICD-10-CM | POA: Diagnosis not present

## 2017-10-26 DIAGNOSIS — D509 Iron deficiency anemia, unspecified: Secondary | ICD-10-CM | POA: Diagnosis not present

## 2017-10-26 DIAGNOSIS — D631 Anemia in chronic kidney disease: Secondary | ICD-10-CM | POA: Diagnosis not present

## 2017-10-28 DIAGNOSIS — N2581 Secondary hyperparathyroidism of renal origin: Secondary | ICD-10-CM | POA: Diagnosis not present

## 2017-10-28 DIAGNOSIS — Z992 Dependence on renal dialysis: Secondary | ICD-10-CM | POA: Diagnosis not present

## 2017-10-28 DIAGNOSIS — N186 End stage renal disease: Secondary | ICD-10-CM | POA: Diagnosis not present

## 2017-10-28 DIAGNOSIS — D631 Anemia in chronic kidney disease: Secondary | ICD-10-CM | POA: Diagnosis not present

## 2017-10-28 DIAGNOSIS — E611 Iron deficiency: Secondary | ICD-10-CM | POA: Diagnosis not present

## 2017-10-28 DIAGNOSIS — D509 Iron deficiency anemia, unspecified: Secondary | ICD-10-CM | POA: Diagnosis not present

## 2017-10-31 DIAGNOSIS — D631 Anemia in chronic kidney disease: Secondary | ICD-10-CM | POA: Diagnosis not present

## 2017-10-31 DIAGNOSIS — Z992 Dependence on renal dialysis: Secondary | ICD-10-CM | POA: Diagnosis not present

## 2017-10-31 DIAGNOSIS — N186 End stage renal disease: Secondary | ICD-10-CM | POA: Diagnosis not present

## 2017-10-31 DIAGNOSIS — D509 Iron deficiency anemia, unspecified: Secondary | ICD-10-CM | POA: Diagnosis not present

## 2017-10-31 DIAGNOSIS — N2581 Secondary hyperparathyroidism of renal origin: Secondary | ICD-10-CM | POA: Diagnosis not present

## 2017-10-31 DIAGNOSIS — E611 Iron deficiency: Secondary | ICD-10-CM | POA: Diagnosis not present

## 2017-11-02 DIAGNOSIS — N186 End stage renal disease: Secondary | ICD-10-CM | POA: Diagnosis not present

## 2017-11-02 DIAGNOSIS — D631 Anemia in chronic kidney disease: Secondary | ICD-10-CM | POA: Diagnosis not present

## 2017-11-02 DIAGNOSIS — D509 Iron deficiency anemia, unspecified: Secondary | ICD-10-CM | POA: Diagnosis not present

## 2017-11-02 DIAGNOSIS — E611 Iron deficiency: Secondary | ICD-10-CM | POA: Diagnosis not present

## 2017-11-02 DIAGNOSIS — N2581 Secondary hyperparathyroidism of renal origin: Secondary | ICD-10-CM | POA: Diagnosis not present

## 2017-11-02 DIAGNOSIS — Z992 Dependence on renal dialysis: Secondary | ICD-10-CM | POA: Diagnosis not present

## 2017-11-03 DIAGNOSIS — Z79899 Other long term (current) drug therapy: Secondary | ICD-10-CM | POA: Diagnosis not present

## 2017-11-03 DIAGNOSIS — Z6835 Body mass index (BMI) 35.0-35.9, adult: Secondary | ICD-10-CM | POA: Diagnosis not present

## 2017-11-03 DIAGNOSIS — R5383 Other fatigue: Secondary | ICD-10-CM | POA: Diagnosis not present

## 2017-11-03 DIAGNOSIS — Z992 Dependence on renal dialysis: Secondary | ICD-10-CM | POA: Diagnosis not present

## 2017-11-03 DIAGNOSIS — Z299 Encounter for prophylactic measures, unspecified: Secondary | ICD-10-CM | POA: Diagnosis not present

## 2017-11-03 DIAGNOSIS — Z1331 Encounter for screening for depression: Secondary | ICD-10-CM | POA: Diagnosis not present

## 2017-11-03 DIAGNOSIS — Z0000 Encounter for general adult medical examination without abnormal findings: Secondary | ICD-10-CM | POA: Diagnosis not present

## 2017-11-03 DIAGNOSIS — E559 Vitamin D deficiency, unspecified: Secondary | ICD-10-CM | POA: Diagnosis not present

## 2017-11-03 DIAGNOSIS — Z7189 Other specified counseling: Secondary | ICD-10-CM | POA: Diagnosis not present

## 2017-11-03 DIAGNOSIS — Z01419 Encounter for gynecological examination (general) (routine) without abnormal findings: Secondary | ICD-10-CM | POA: Diagnosis not present

## 2017-11-03 DIAGNOSIS — E7800 Pure hypercholesterolemia, unspecified: Secondary | ICD-10-CM | POA: Diagnosis not present

## 2017-11-03 DIAGNOSIS — Z1339 Encounter for screening examination for other mental health and behavioral disorders: Secondary | ICD-10-CM | POA: Diagnosis not present

## 2017-11-04 DIAGNOSIS — E611 Iron deficiency: Secondary | ICD-10-CM | POA: Diagnosis not present

## 2017-11-04 DIAGNOSIS — Z992 Dependence on renal dialysis: Secondary | ICD-10-CM | POA: Diagnosis not present

## 2017-11-04 DIAGNOSIS — N2581 Secondary hyperparathyroidism of renal origin: Secondary | ICD-10-CM | POA: Diagnosis not present

## 2017-11-04 DIAGNOSIS — N186 End stage renal disease: Secondary | ICD-10-CM | POA: Diagnosis not present

## 2017-11-04 DIAGNOSIS — D631 Anemia in chronic kidney disease: Secondary | ICD-10-CM | POA: Diagnosis not present

## 2017-11-04 DIAGNOSIS — D509 Iron deficiency anemia, unspecified: Secondary | ICD-10-CM | POA: Diagnosis not present

## 2017-11-06 DIAGNOSIS — E611 Iron deficiency: Secondary | ICD-10-CM | POA: Diagnosis not present

## 2017-11-06 DIAGNOSIS — Z992 Dependence on renal dialysis: Secondary | ICD-10-CM | POA: Diagnosis not present

## 2017-11-06 DIAGNOSIS — N2581 Secondary hyperparathyroidism of renal origin: Secondary | ICD-10-CM | POA: Diagnosis not present

## 2017-11-06 DIAGNOSIS — D509 Iron deficiency anemia, unspecified: Secondary | ICD-10-CM | POA: Diagnosis not present

## 2017-11-06 DIAGNOSIS — D631 Anemia in chronic kidney disease: Secondary | ICD-10-CM | POA: Diagnosis not present

## 2017-11-06 DIAGNOSIS — N186 End stage renal disease: Secondary | ICD-10-CM | POA: Diagnosis not present

## 2017-11-09 DIAGNOSIS — N186 End stage renal disease: Secondary | ICD-10-CM | POA: Diagnosis not present

## 2017-11-09 DIAGNOSIS — N2581 Secondary hyperparathyroidism of renal origin: Secondary | ICD-10-CM | POA: Diagnosis not present

## 2017-11-09 DIAGNOSIS — D509 Iron deficiency anemia, unspecified: Secondary | ICD-10-CM | POA: Diagnosis not present

## 2017-11-09 DIAGNOSIS — E611 Iron deficiency: Secondary | ICD-10-CM | POA: Diagnosis not present

## 2017-11-09 DIAGNOSIS — Z992 Dependence on renal dialysis: Secondary | ICD-10-CM | POA: Diagnosis not present

## 2017-11-09 DIAGNOSIS — D631 Anemia in chronic kidney disease: Secondary | ICD-10-CM | POA: Diagnosis not present

## 2017-11-11 DIAGNOSIS — E611 Iron deficiency: Secondary | ICD-10-CM | POA: Diagnosis not present

## 2017-11-11 DIAGNOSIS — N186 End stage renal disease: Secondary | ICD-10-CM | POA: Diagnosis not present

## 2017-11-11 DIAGNOSIS — Z992 Dependence on renal dialysis: Secondary | ICD-10-CM | POA: Diagnosis not present

## 2017-11-11 DIAGNOSIS — D631 Anemia in chronic kidney disease: Secondary | ICD-10-CM | POA: Diagnosis not present

## 2017-11-11 DIAGNOSIS — N2581 Secondary hyperparathyroidism of renal origin: Secondary | ICD-10-CM | POA: Diagnosis not present

## 2017-11-11 DIAGNOSIS — D509 Iron deficiency anemia, unspecified: Secondary | ICD-10-CM | POA: Diagnosis not present

## 2017-11-13 DIAGNOSIS — E611 Iron deficiency: Secondary | ICD-10-CM | POA: Diagnosis not present

## 2017-11-13 DIAGNOSIS — Z992 Dependence on renal dialysis: Secondary | ICD-10-CM | POA: Diagnosis not present

## 2017-11-13 DIAGNOSIS — D509 Iron deficiency anemia, unspecified: Secondary | ICD-10-CM | POA: Diagnosis not present

## 2017-11-13 DIAGNOSIS — N2581 Secondary hyperparathyroidism of renal origin: Secondary | ICD-10-CM | POA: Diagnosis not present

## 2017-11-13 DIAGNOSIS — D631 Anemia in chronic kidney disease: Secondary | ICD-10-CM | POA: Diagnosis not present

## 2017-11-13 DIAGNOSIS — N186 End stage renal disease: Secondary | ICD-10-CM | POA: Diagnosis not present

## 2017-11-14 DIAGNOSIS — N186 End stage renal disease: Secondary | ICD-10-CM | POA: Diagnosis not present

## 2017-11-14 DIAGNOSIS — Z992 Dependence on renal dialysis: Secondary | ICD-10-CM | POA: Diagnosis not present

## 2017-11-16 DIAGNOSIS — N2581 Secondary hyperparathyroidism of renal origin: Secondary | ICD-10-CM | POA: Diagnosis not present

## 2017-11-16 DIAGNOSIS — N186 End stage renal disease: Secondary | ICD-10-CM | POA: Diagnosis not present

## 2017-11-16 DIAGNOSIS — Z992 Dependence on renal dialysis: Secondary | ICD-10-CM | POA: Diagnosis not present

## 2017-11-16 DIAGNOSIS — E611 Iron deficiency: Secondary | ICD-10-CM | POA: Diagnosis not present

## 2017-11-16 DIAGNOSIS — D631 Anemia in chronic kidney disease: Secondary | ICD-10-CM | POA: Diagnosis not present

## 2017-11-18 DIAGNOSIS — E611 Iron deficiency: Secondary | ICD-10-CM | POA: Diagnosis not present

## 2017-11-18 DIAGNOSIS — N186 End stage renal disease: Secondary | ICD-10-CM | POA: Diagnosis not present

## 2017-11-18 DIAGNOSIS — D631 Anemia in chronic kidney disease: Secondary | ICD-10-CM | POA: Diagnosis not present

## 2017-11-18 DIAGNOSIS — N2581 Secondary hyperparathyroidism of renal origin: Secondary | ICD-10-CM | POA: Diagnosis not present

## 2017-11-18 DIAGNOSIS — Z992 Dependence on renal dialysis: Secondary | ICD-10-CM | POA: Diagnosis not present

## 2017-11-21 DIAGNOSIS — Z992 Dependence on renal dialysis: Secondary | ICD-10-CM | POA: Diagnosis not present

## 2017-11-21 DIAGNOSIS — N2581 Secondary hyperparathyroidism of renal origin: Secondary | ICD-10-CM | POA: Diagnosis not present

## 2017-11-21 DIAGNOSIS — D631 Anemia in chronic kidney disease: Secondary | ICD-10-CM | POA: Diagnosis not present

## 2017-11-21 DIAGNOSIS — E611 Iron deficiency: Secondary | ICD-10-CM | POA: Diagnosis not present

## 2017-11-21 DIAGNOSIS — N186 End stage renal disease: Secondary | ICD-10-CM | POA: Diagnosis not present

## 2017-11-23 DIAGNOSIS — Z992 Dependence on renal dialysis: Secondary | ICD-10-CM | POA: Diagnosis not present

## 2017-11-23 DIAGNOSIS — N186 End stage renal disease: Secondary | ICD-10-CM | POA: Diagnosis not present

## 2017-11-23 DIAGNOSIS — D631 Anemia in chronic kidney disease: Secondary | ICD-10-CM | POA: Diagnosis not present

## 2017-11-23 DIAGNOSIS — N2581 Secondary hyperparathyroidism of renal origin: Secondary | ICD-10-CM | POA: Diagnosis not present

## 2017-11-23 DIAGNOSIS — E611 Iron deficiency: Secondary | ICD-10-CM | POA: Diagnosis not present

## 2017-11-25 DIAGNOSIS — E611 Iron deficiency: Secondary | ICD-10-CM | POA: Diagnosis not present

## 2017-11-25 DIAGNOSIS — N2581 Secondary hyperparathyroidism of renal origin: Secondary | ICD-10-CM | POA: Diagnosis not present

## 2017-11-25 DIAGNOSIS — D631 Anemia in chronic kidney disease: Secondary | ICD-10-CM | POA: Diagnosis not present

## 2017-11-25 DIAGNOSIS — N186 End stage renal disease: Secondary | ICD-10-CM | POA: Diagnosis not present

## 2017-11-25 DIAGNOSIS — Z992 Dependence on renal dialysis: Secondary | ICD-10-CM | POA: Diagnosis not present

## 2017-11-28 DIAGNOSIS — E611 Iron deficiency: Secondary | ICD-10-CM | POA: Diagnosis not present

## 2017-11-28 DIAGNOSIS — N2581 Secondary hyperparathyroidism of renal origin: Secondary | ICD-10-CM | POA: Diagnosis not present

## 2017-11-28 DIAGNOSIS — D631 Anemia in chronic kidney disease: Secondary | ICD-10-CM | POA: Diagnosis not present

## 2017-11-28 DIAGNOSIS — N186 End stage renal disease: Secondary | ICD-10-CM | POA: Diagnosis not present

## 2017-11-28 DIAGNOSIS — Z992 Dependence on renal dialysis: Secondary | ICD-10-CM | POA: Diagnosis not present

## 2017-11-30 DIAGNOSIS — N2581 Secondary hyperparathyroidism of renal origin: Secondary | ICD-10-CM | POA: Diagnosis not present

## 2017-11-30 DIAGNOSIS — D631 Anemia in chronic kidney disease: Secondary | ICD-10-CM | POA: Diagnosis not present

## 2017-11-30 DIAGNOSIS — Z992 Dependence on renal dialysis: Secondary | ICD-10-CM | POA: Diagnosis not present

## 2017-11-30 DIAGNOSIS — N186 End stage renal disease: Secondary | ICD-10-CM | POA: Diagnosis not present

## 2017-11-30 DIAGNOSIS — E611 Iron deficiency: Secondary | ICD-10-CM | POA: Diagnosis not present

## 2017-12-02 DIAGNOSIS — N2581 Secondary hyperparathyroidism of renal origin: Secondary | ICD-10-CM | POA: Diagnosis not present

## 2017-12-02 DIAGNOSIS — Z992 Dependence on renal dialysis: Secondary | ICD-10-CM | POA: Diagnosis not present

## 2017-12-02 DIAGNOSIS — N186 End stage renal disease: Secondary | ICD-10-CM | POA: Diagnosis not present

## 2017-12-02 DIAGNOSIS — E611 Iron deficiency: Secondary | ICD-10-CM | POA: Diagnosis not present

## 2017-12-02 DIAGNOSIS — D631 Anemia in chronic kidney disease: Secondary | ICD-10-CM | POA: Diagnosis not present

## 2017-12-05 DIAGNOSIS — N2581 Secondary hyperparathyroidism of renal origin: Secondary | ICD-10-CM | POA: Diagnosis not present

## 2017-12-05 DIAGNOSIS — E611 Iron deficiency: Secondary | ICD-10-CM | POA: Diagnosis not present

## 2017-12-05 DIAGNOSIS — D631 Anemia in chronic kidney disease: Secondary | ICD-10-CM | POA: Diagnosis not present

## 2017-12-05 DIAGNOSIS — N186 End stage renal disease: Secondary | ICD-10-CM | POA: Diagnosis not present

## 2017-12-05 DIAGNOSIS — Z992 Dependence on renal dialysis: Secondary | ICD-10-CM | POA: Diagnosis not present

## 2017-12-07 DIAGNOSIS — N186 End stage renal disease: Secondary | ICD-10-CM | POA: Diagnosis not present

## 2017-12-07 DIAGNOSIS — Z992 Dependence on renal dialysis: Secondary | ICD-10-CM | POA: Diagnosis not present

## 2017-12-07 DIAGNOSIS — E611 Iron deficiency: Secondary | ICD-10-CM | POA: Diagnosis not present

## 2017-12-07 DIAGNOSIS — N2581 Secondary hyperparathyroidism of renal origin: Secondary | ICD-10-CM | POA: Diagnosis not present

## 2017-12-07 DIAGNOSIS — D631 Anemia in chronic kidney disease: Secondary | ICD-10-CM | POA: Diagnosis not present

## 2017-12-09 DIAGNOSIS — Z992 Dependence on renal dialysis: Secondary | ICD-10-CM | POA: Diagnosis not present

## 2017-12-09 DIAGNOSIS — D631 Anemia in chronic kidney disease: Secondary | ICD-10-CM | POA: Diagnosis not present

## 2017-12-09 DIAGNOSIS — N2581 Secondary hyperparathyroidism of renal origin: Secondary | ICD-10-CM | POA: Diagnosis not present

## 2017-12-09 DIAGNOSIS — N186 End stage renal disease: Secondary | ICD-10-CM | POA: Diagnosis not present

## 2017-12-09 DIAGNOSIS — E611 Iron deficiency: Secondary | ICD-10-CM | POA: Diagnosis not present

## 2017-12-12 DIAGNOSIS — D631 Anemia in chronic kidney disease: Secondary | ICD-10-CM | POA: Diagnosis not present

## 2017-12-12 DIAGNOSIS — Z992 Dependence on renal dialysis: Secondary | ICD-10-CM | POA: Diagnosis not present

## 2017-12-12 DIAGNOSIS — N186 End stage renal disease: Secondary | ICD-10-CM | POA: Diagnosis not present

## 2017-12-12 DIAGNOSIS — E611 Iron deficiency: Secondary | ICD-10-CM | POA: Diagnosis not present

## 2017-12-12 DIAGNOSIS — N2581 Secondary hyperparathyroidism of renal origin: Secondary | ICD-10-CM | POA: Diagnosis not present

## 2017-12-14 DIAGNOSIS — D631 Anemia in chronic kidney disease: Secondary | ICD-10-CM | POA: Diagnosis not present

## 2017-12-14 DIAGNOSIS — E611 Iron deficiency: Secondary | ICD-10-CM | POA: Diagnosis not present

## 2017-12-14 DIAGNOSIS — N186 End stage renal disease: Secondary | ICD-10-CM | POA: Diagnosis not present

## 2017-12-14 DIAGNOSIS — N2581 Secondary hyperparathyroidism of renal origin: Secondary | ICD-10-CM | POA: Diagnosis not present

## 2017-12-14 DIAGNOSIS — Z992 Dependence on renal dialysis: Secondary | ICD-10-CM | POA: Diagnosis not present

## 2017-12-15 DIAGNOSIS — Z992 Dependence on renal dialysis: Secondary | ICD-10-CM | POA: Diagnosis not present

## 2017-12-15 DIAGNOSIS — N186 End stage renal disease: Secondary | ICD-10-CM | POA: Diagnosis not present

## 2017-12-16 DIAGNOSIS — N186 End stage renal disease: Secondary | ICD-10-CM | POA: Diagnosis not present

## 2017-12-16 DIAGNOSIS — D631 Anemia in chronic kidney disease: Secondary | ICD-10-CM | POA: Diagnosis not present

## 2017-12-16 DIAGNOSIS — N2581 Secondary hyperparathyroidism of renal origin: Secondary | ICD-10-CM | POA: Diagnosis not present

## 2017-12-16 DIAGNOSIS — Z992 Dependence on renal dialysis: Secondary | ICD-10-CM | POA: Diagnosis not present

## 2017-12-19 DIAGNOSIS — N186 End stage renal disease: Secondary | ICD-10-CM | POA: Diagnosis not present

## 2017-12-19 DIAGNOSIS — D631 Anemia in chronic kidney disease: Secondary | ICD-10-CM | POA: Diagnosis not present

## 2017-12-19 DIAGNOSIS — N2581 Secondary hyperparathyroidism of renal origin: Secondary | ICD-10-CM | POA: Diagnosis not present

## 2017-12-19 DIAGNOSIS — Z992 Dependence on renal dialysis: Secondary | ICD-10-CM | POA: Diagnosis not present

## 2017-12-21 DIAGNOSIS — N186 End stage renal disease: Secondary | ICD-10-CM | POA: Diagnosis not present

## 2017-12-21 DIAGNOSIS — D631 Anemia in chronic kidney disease: Secondary | ICD-10-CM | POA: Diagnosis not present

## 2017-12-21 DIAGNOSIS — Z992 Dependence on renal dialysis: Secondary | ICD-10-CM | POA: Diagnosis not present

## 2017-12-21 DIAGNOSIS — N2581 Secondary hyperparathyroidism of renal origin: Secondary | ICD-10-CM | POA: Diagnosis not present

## 2017-12-23 DIAGNOSIS — D631 Anemia in chronic kidney disease: Secondary | ICD-10-CM | POA: Diagnosis not present

## 2017-12-23 DIAGNOSIS — N186 End stage renal disease: Secondary | ICD-10-CM | POA: Diagnosis not present

## 2017-12-23 DIAGNOSIS — Z992 Dependence on renal dialysis: Secondary | ICD-10-CM | POA: Diagnosis not present

## 2017-12-23 DIAGNOSIS — N2581 Secondary hyperparathyroidism of renal origin: Secondary | ICD-10-CM | POA: Diagnosis not present

## 2017-12-26 DIAGNOSIS — Z992 Dependence on renal dialysis: Secondary | ICD-10-CM | POA: Diagnosis not present

## 2017-12-26 DIAGNOSIS — N2581 Secondary hyperparathyroidism of renal origin: Secondary | ICD-10-CM | POA: Diagnosis not present

## 2017-12-26 DIAGNOSIS — N186 End stage renal disease: Secondary | ICD-10-CM | POA: Diagnosis not present

## 2017-12-26 DIAGNOSIS — D631 Anemia in chronic kidney disease: Secondary | ICD-10-CM | POA: Diagnosis not present

## 2017-12-28 DIAGNOSIS — D631 Anemia in chronic kidney disease: Secondary | ICD-10-CM | POA: Diagnosis not present

## 2017-12-28 DIAGNOSIS — N186 End stage renal disease: Secondary | ICD-10-CM | POA: Diagnosis not present

## 2017-12-28 DIAGNOSIS — N2581 Secondary hyperparathyroidism of renal origin: Secondary | ICD-10-CM | POA: Diagnosis not present

## 2017-12-28 DIAGNOSIS — Z992 Dependence on renal dialysis: Secondary | ICD-10-CM | POA: Diagnosis not present

## 2017-12-30 DIAGNOSIS — D631 Anemia in chronic kidney disease: Secondary | ICD-10-CM | POA: Diagnosis not present

## 2017-12-30 DIAGNOSIS — Z992 Dependence on renal dialysis: Secondary | ICD-10-CM | POA: Diagnosis not present

## 2017-12-30 DIAGNOSIS — N186 End stage renal disease: Secondary | ICD-10-CM | POA: Diagnosis not present

## 2017-12-30 DIAGNOSIS — N2581 Secondary hyperparathyroidism of renal origin: Secondary | ICD-10-CM | POA: Diagnosis not present

## 2018-01-02 DIAGNOSIS — Z992 Dependence on renal dialysis: Secondary | ICD-10-CM | POA: Diagnosis not present

## 2018-01-02 DIAGNOSIS — N2581 Secondary hyperparathyroidism of renal origin: Secondary | ICD-10-CM | POA: Diagnosis not present

## 2018-01-02 DIAGNOSIS — N186 End stage renal disease: Secondary | ICD-10-CM | POA: Diagnosis not present

## 2018-01-02 DIAGNOSIS — D631 Anemia in chronic kidney disease: Secondary | ICD-10-CM | POA: Diagnosis not present

## 2018-01-04 DIAGNOSIS — N2581 Secondary hyperparathyroidism of renal origin: Secondary | ICD-10-CM | POA: Diagnosis not present

## 2018-01-04 DIAGNOSIS — N186 End stage renal disease: Secondary | ICD-10-CM | POA: Diagnosis not present

## 2018-01-04 DIAGNOSIS — D631 Anemia in chronic kidney disease: Secondary | ICD-10-CM | POA: Diagnosis not present

## 2018-01-04 DIAGNOSIS — Z992 Dependence on renal dialysis: Secondary | ICD-10-CM | POA: Diagnosis not present

## 2018-01-06 DIAGNOSIS — Z992 Dependence on renal dialysis: Secondary | ICD-10-CM | POA: Diagnosis not present

## 2018-01-06 DIAGNOSIS — N186 End stage renal disease: Secondary | ICD-10-CM | POA: Diagnosis not present

## 2018-01-06 DIAGNOSIS — N2581 Secondary hyperparathyroidism of renal origin: Secondary | ICD-10-CM | POA: Diagnosis not present

## 2018-01-06 DIAGNOSIS — D631 Anemia in chronic kidney disease: Secondary | ICD-10-CM | POA: Diagnosis not present

## 2018-01-09 DIAGNOSIS — N186 End stage renal disease: Secondary | ICD-10-CM | POA: Diagnosis not present

## 2018-01-09 DIAGNOSIS — N2581 Secondary hyperparathyroidism of renal origin: Secondary | ICD-10-CM | POA: Diagnosis not present

## 2018-01-09 DIAGNOSIS — D631 Anemia in chronic kidney disease: Secondary | ICD-10-CM | POA: Diagnosis not present

## 2018-01-09 DIAGNOSIS — Z992 Dependence on renal dialysis: Secondary | ICD-10-CM | POA: Diagnosis not present

## 2018-01-11 DIAGNOSIS — D631 Anemia in chronic kidney disease: Secondary | ICD-10-CM | POA: Diagnosis not present

## 2018-01-11 DIAGNOSIS — N186 End stage renal disease: Secondary | ICD-10-CM | POA: Diagnosis not present

## 2018-01-11 DIAGNOSIS — N2581 Secondary hyperparathyroidism of renal origin: Secondary | ICD-10-CM | POA: Diagnosis not present

## 2018-01-11 DIAGNOSIS — Z992 Dependence on renal dialysis: Secondary | ICD-10-CM | POA: Diagnosis not present

## 2018-01-12 DIAGNOSIS — N186 End stage renal disease: Secondary | ICD-10-CM | POA: Diagnosis not present

## 2018-01-12 DIAGNOSIS — Z992 Dependence on renal dialysis: Secondary | ICD-10-CM | POA: Diagnosis not present

## 2018-01-13 DIAGNOSIS — D631 Anemia in chronic kidney disease: Secondary | ICD-10-CM | POA: Diagnosis not present

## 2018-01-13 DIAGNOSIS — D509 Iron deficiency anemia, unspecified: Secondary | ICD-10-CM | POA: Diagnosis not present

## 2018-01-13 DIAGNOSIS — E611 Iron deficiency: Secondary | ICD-10-CM | POA: Diagnosis not present

## 2018-01-13 DIAGNOSIS — N2581 Secondary hyperparathyroidism of renal origin: Secondary | ICD-10-CM | POA: Diagnosis not present

## 2018-01-13 DIAGNOSIS — N186 End stage renal disease: Secondary | ICD-10-CM | POA: Diagnosis not present

## 2018-01-13 DIAGNOSIS — Z992 Dependence on renal dialysis: Secondary | ICD-10-CM | POA: Diagnosis not present

## 2018-01-16 DIAGNOSIS — D631 Anemia in chronic kidney disease: Secondary | ICD-10-CM | POA: Diagnosis not present

## 2018-01-16 DIAGNOSIS — N2581 Secondary hyperparathyroidism of renal origin: Secondary | ICD-10-CM | POA: Diagnosis not present

## 2018-01-16 DIAGNOSIS — N186 End stage renal disease: Secondary | ICD-10-CM | POA: Diagnosis not present

## 2018-01-16 DIAGNOSIS — Z992 Dependence on renal dialysis: Secondary | ICD-10-CM | POA: Diagnosis not present

## 2018-01-16 DIAGNOSIS — D509 Iron deficiency anemia, unspecified: Secondary | ICD-10-CM | POA: Diagnosis not present

## 2018-01-16 DIAGNOSIS — E611 Iron deficiency: Secondary | ICD-10-CM | POA: Diagnosis not present

## 2018-01-18 DIAGNOSIS — N186 End stage renal disease: Secondary | ICD-10-CM | POA: Diagnosis not present

## 2018-01-18 DIAGNOSIS — Z992 Dependence on renal dialysis: Secondary | ICD-10-CM | POA: Diagnosis not present

## 2018-01-18 DIAGNOSIS — E611 Iron deficiency: Secondary | ICD-10-CM | POA: Diagnosis not present

## 2018-01-18 DIAGNOSIS — D509 Iron deficiency anemia, unspecified: Secondary | ICD-10-CM | POA: Diagnosis not present

## 2018-01-18 DIAGNOSIS — D631 Anemia in chronic kidney disease: Secondary | ICD-10-CM | POA: Diagnosis not present

## 2018-01-18 DIAGNOSIS — N2581 Secondary hyperparathyroidism of renal origin: Secondary | ICD-10-CM | POA: Diagnosis not present

## 2018-01-20 DIAGNOSIS — D509 Iron deficiency anemia, unspecified: Secondary | ICD-10-CM | POA: Diagnosis not present

## 2018-01-20 DIAGNOSIS — Z992 Dependence on renal dialysis: Secondary | ICD-10-CM | POA: Diagnosis not present

## 2018-01-20 DIAGNOSIS — N186 End stage renal disease: Secondary | ICD-10-CM | POA: Diagnosis not present

## 2018-01-20 DIAGNOSIS — D631 Anemia in chronic kidney disease: Secondary | ICD-10-CM | POA: Diagnosis not present

## 2018-01-20 DIAGNOSIS — E611 Iron deficiency: Secondary | ICD-10-CM | POA: Diagnosis not present

## 2018-01-20 DIAGNOSIS — N2581 Secondary hyperparathyroidism of renal origin: Secondary | ICD-10-CM | POA: Diagnosis not present

## 2018-01-23 DIAGNOSIS — D509 Iron deficiency anemia, unspecified: Secondary | ICD-10-CM | POA: Diagnosis not present

## 2018-01-23 DIAGNOSIS — N2581 Secondary hyperparathyroidism of renal origin: Secondary | ICD-10-CM | POA: Diagnosis not present

## 2018-01-23 DIAGNOSIS — E611 Iron deficiency: Secondary | ICD-10-CM | POA: Diagnosis not present

## 2018-01-23 DIAGNOSIS — D631 Anemia in chronic kidney disease: Secondary | ICD-10-CM | POA: Diagnosis not present

## 2018-01-23 DIAGNOSIS — Z992 Dependence on renal dialysis: Secondary | ICD-10-CM | POA: Diagnosis not present

## 2018-01-23 DIAGNOSIS — N186 End stage renal disease: Secondary | ICD-10-CM | POA: Diagnosis not present

## 2018-01-25 DIAGNOSIS — N186 End stage renal disease: Secondary | ICD-10-CM | POA: Diagnosis not present

## 2018-01-25 DIAGNOSIS — D631 Anemia in chronic kidney disease: Secondary | ICD-10-CM | POA: Diagnosis not present

## 2018-01-25 DIAGNOSIS — D509 Iron deficiency anemia, unspecified: Secondary | ICD-10-CM | POA: Diagnosis not present

## 2018-01-25 DIAGNOSIS — E611 Iron deficiency: Secondary | ICD-10-CM | POA: Diagnosis not present

## 2018-01-25 DIAGNOSIS — N2581 Secondary hyperparathyroidism of renal origin: Secondary | ICD-10-CM | POA: Diagnosis not present

## 2018-01-25 DIAGNOSIS — Z992 Dependence on renal dialysis: Secondary | ICD-10-CM | POA: Diagnosis not present

## 2018-01-27 DIAGNOSIS — N2581 Secondary hyperparathyroidism of renal origin: Secondary | ICD-10-CM | POA: Diagnosis not present

## 2018-01-27 DIAGNOSIS — E611 Iron deficiency: Secondary | ICD-10-CM | POA: Diagnosis not present

## 2018-01-27 DIAGNOSIS — D631 Anemia in chronic kidney disease: Secondary | ICD-10-CM | POA: Diagnosis not present

## 2018-01-27 DIAGNOSIS — N186 End stage renal disease: Secondary | ICD-10-CM | POA: Diagnosis not present

## 2018-01-27 DIAGNOSIS — D509 Iron deficiency anemia, unspecified: Secondary | ICD-10-CM | POA: Diagnosis not present

## 2018-01-27 DIAGNOSIS — Z992 Dependence on renal dialysis: Secondary | ICD-10-CM | POA: Diagnosis not present

## 2018-01-30 DIAGNOSIS — E611 Iron deficiency: Secondary | ICD-10-CM | POA: Diagnosis not present

## 2018-01-30 DIAGNOSIS — N2581 Secondary hyperparathyroidism of renal origin: Secondary | ICD-10-CM | POA: Diagnosis not present

## 2018-01-30 DIAGNOSIS — Z992 Dependence on renal dialysis: Secondary | ICD-10-CM | POA: Diagnosis not present

## 2018-01-30 DIAGNOSIS — D509 Iron deficiency anemia, unspecified: Secondary | ICD-10-CM | POA: Diagnosis not present

## 2018-01-30 DIAGNOSIS — N186 End stage renal disease: Secondary | ICD-10-CM | POA: Diagnosis not present

## 2018-01-30 DIAGNOSIS — D631 Anemia in chronic kidney disease: Secondary | ICD-10-CM | POA: Diagnosis not present

## 2018-02-01 DIAGNOSIS — D631 Anemia in chronic kidney disease: Secondary | ICD-10-CM | POA: Diagnosis not present

## 2018-02-01 DIAGNOSIS — Z992 Dependence on renal dialysis: Secondary | ICD-10-CM | POA: Diagnosis not present

## 2018-02-01 DIAGNOSIS — N2581 Secondary hyperparathyroidism of renal origin: Secondary | ICD-10-CM | POA: Diagnosis not present

## 2018-02-01 DIAGNOSIS — N186 End stage renal disease: Secondary | ICD-10-CM | POA: Diagnosis not present

## 2018-02-01 DIAGNOSIS — E611 Iron deficiency: Secondary | ICD-10-CM | POA: Diagnosis not present

## 2018-02-01 DIAGNOSIS — D509 Iron deficiency anemia, unspecified: Secondary | ICD-10-CM | POA: Diagnosis not present

## 2018-02-03 DIAGNOSIS — Z992 Dependence on renal dialysis: Secondary | ICD-10-CM | POA: Diagnosis not present

## 2018-02-03 DIAGNOSIS — N2581 Secondary hyperparathyroidism of renal origin: Secondary | ICD-10-CM | POA: Diagnosis not present

## 2018-02-03 DIAGNOSIS — N186 End stage renal disease: Secondary | ICD-10-CM | POA: Diagnosis not present

## 2018-02-03 DIAGNOSIS — D509 Iron deficiency anemia, unspecified: Secondary | ICD-10-CM | POA: Diagnosis not present

## 2018-02-03 DIAGNOSIS — E611 Iron deficiency: Secondary | ICD-10-CM | POA: Diagnosis not present

## 2018-02-03 DIAGNOSIS — D631 Anemia in chronic kidney disease: Secondary | ICD-10-CM | POA: Diagnosis not present

## 2018-02-06 DIAGNOSIS — N2581 Secondary hyperparathyroidism of renal origin: Secondary | ICD-10-CM | POA: Diagnosis not present

## 2018-02-06 DIAGNOSIS — D631 Anemia in chronic kidney disease: Secondary | ICD-10-CM | POA: Diagnosis not present

## 2018-02-06 DIAGNOSIS — Z992 Dependence on renal dialysis: Secondary | ICD-10-CM | POA: Diagnosis not present

## 2018-02-06 DIAGNOSIS — D509 Iron deficiency anemia, unspecified: Secondary | ICD-10-CM | POA: Diagnosis not present

## 2018-02-06 DIAGNOSIS — E611 Iron deficiency: Secondary | ICD-10-CM | POA: Diagnosis not present

## 2018-02-06 DIAGNOSIS — N186 End stage renal disease: Secondary | ICD-10-CM | POA: Diagnosis not present

## 2018-02-06 IMAGING — XA IR AV DIALY SHUNT INTRO NEEDLE/INTRACATH INITIAL W/PTA/IMG*L*
1 series · 13 of 24 positions shown · IV contrast (IODINE)
Comparison: Fistulagram with angioplasty - 08/20/2016 (performed by
vascular surgery).

INDICATION: History of end-stage renal disease with chronic left upper arm
native AV fistula, now with decreased flows at dialysis.

EXAM:
1. ULTRASOUND GUIDANCE FOR FISTULA ACCESS
2. FLUOROSCOPIC GUIDED LEFT UPPER ARM FISTULAGRAM
3. FLUOROSCOPIC GUIDED VENOUS ANGIOPLASTY

[Series 300: dsa body · 13 of 38 slices shown]
[im 1/38]
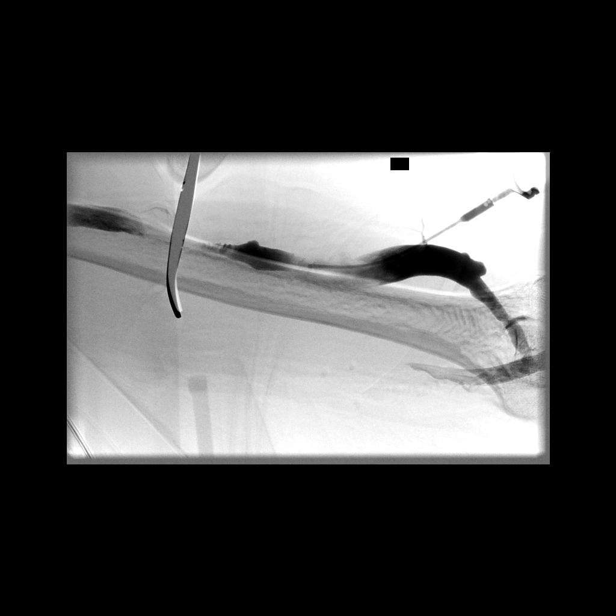
[im 4/38]
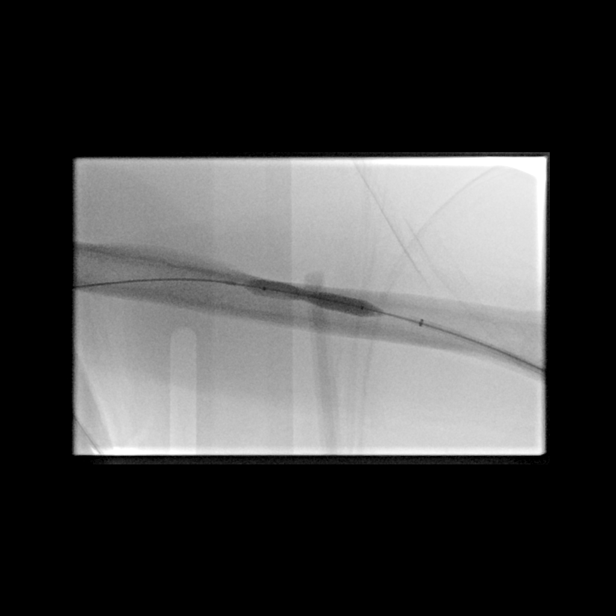
[im 7/38]
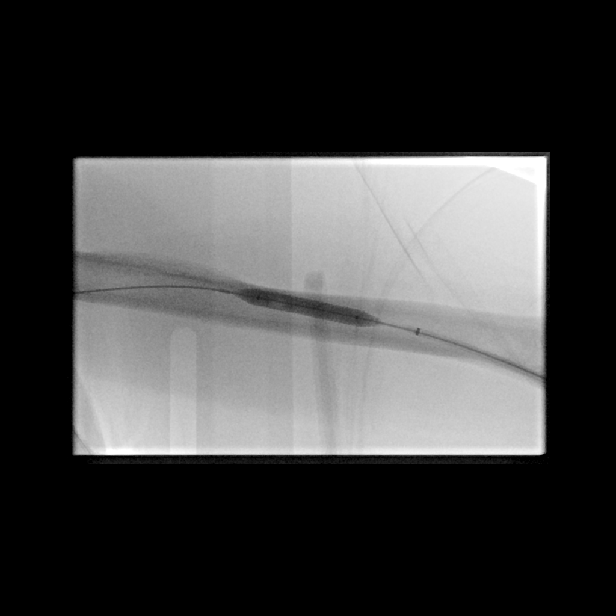
[im 10/38]
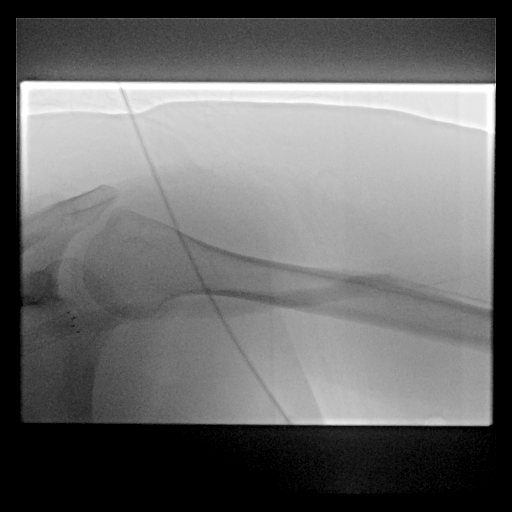
[im 13/38]
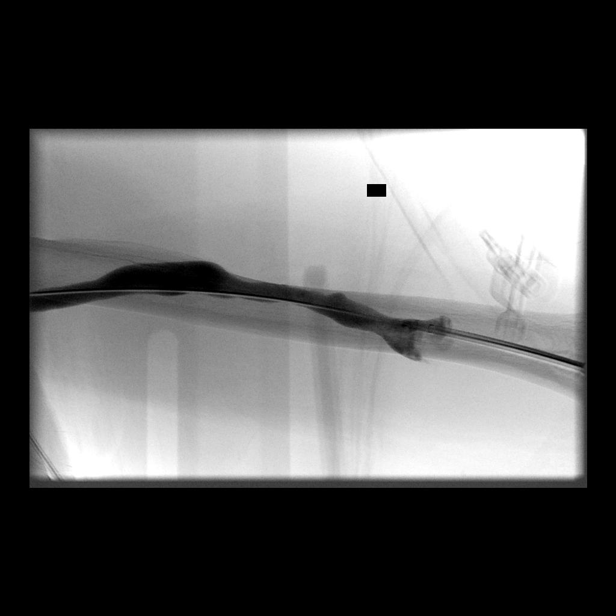
[im 17/38]
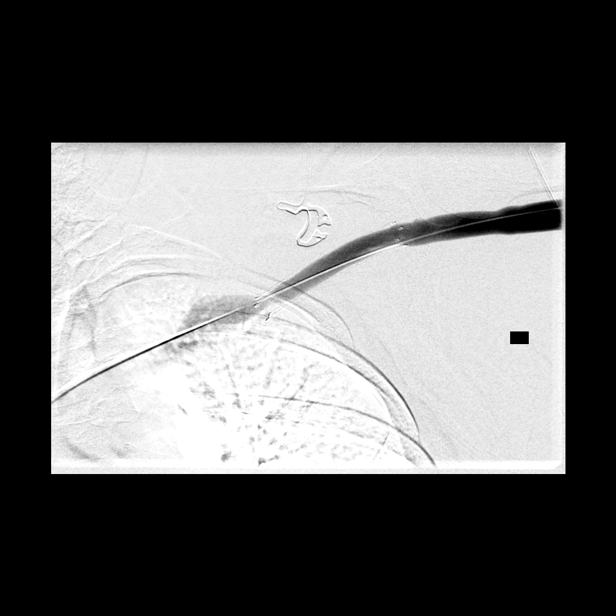
[im 20/38]
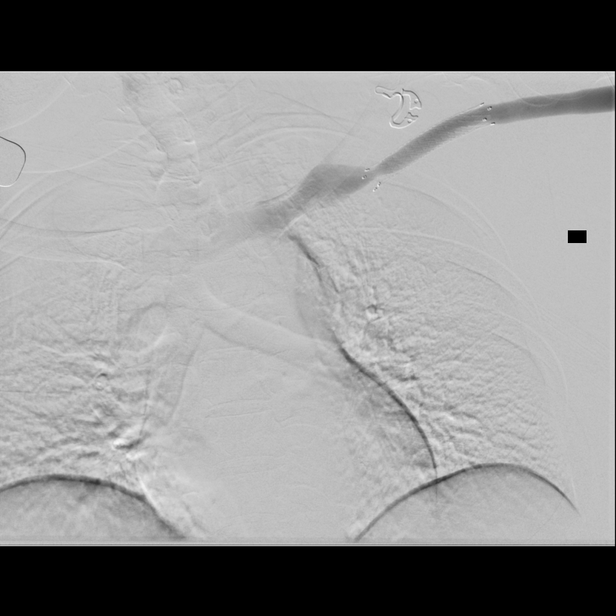
[im 21/38]
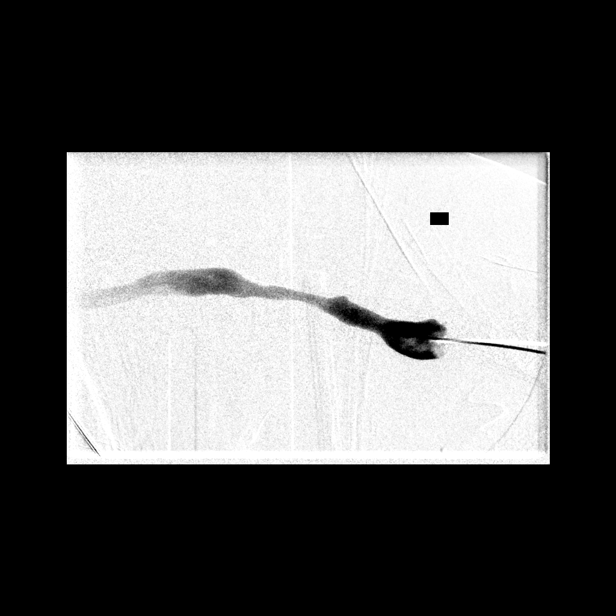
[im 25/38]
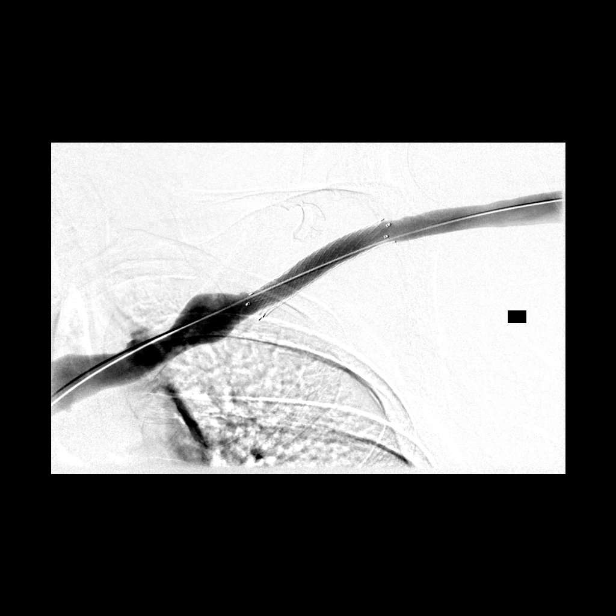
[im 28/38]
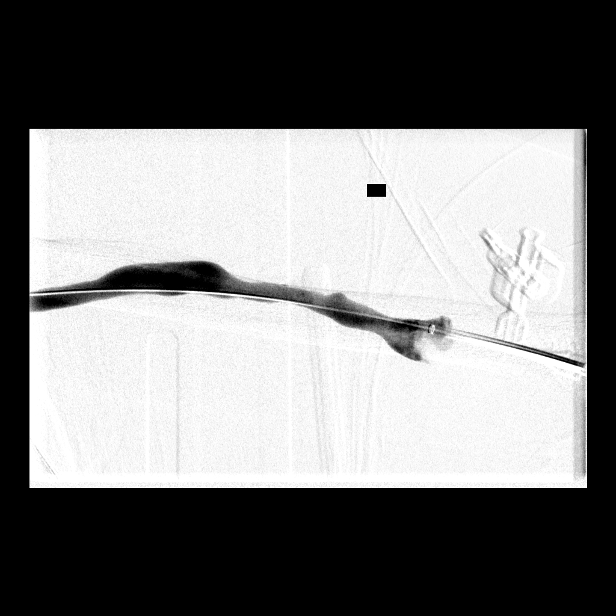
[im 31/38]
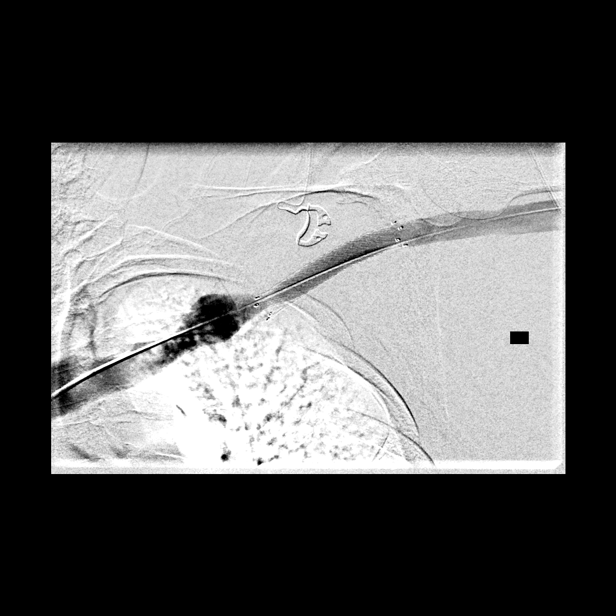
[im 34/38]
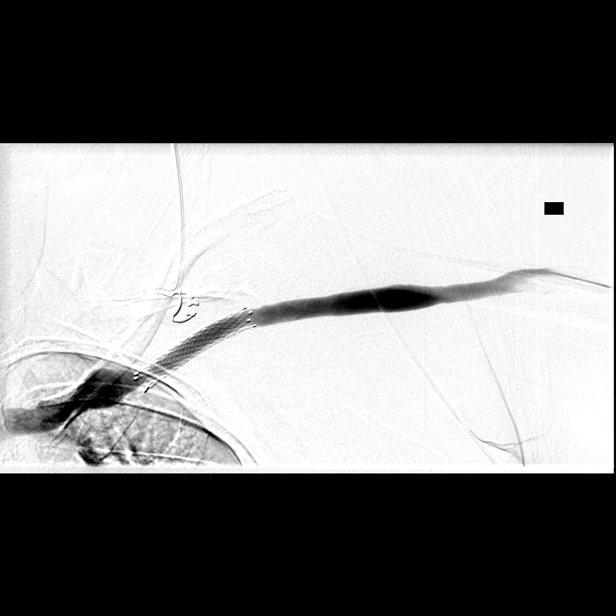
[im 38/38]
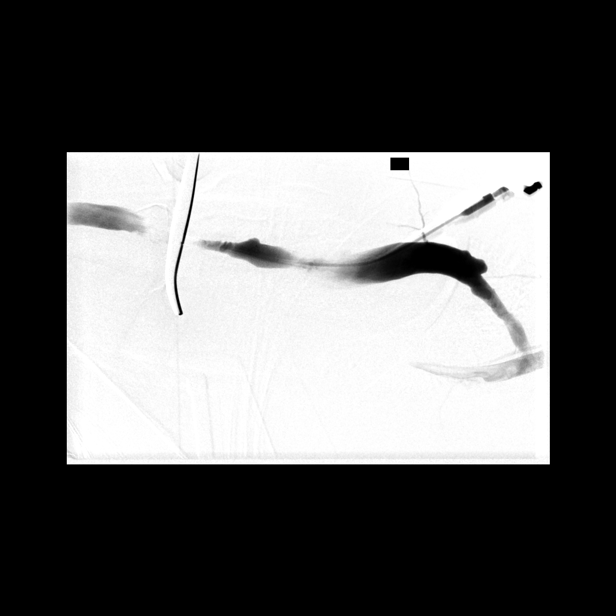

[13 of 24 positions shown; findings below may reference images not displayed]

MEDICATIONS:
None.

CONTRAST:  30 cc [id number]

ANESTHESIA/SEDATION:
Moderate (conscious) sedation was employed during this procedure. A
total of Versed 2 mg and Fentanyl 100 mcg was administered
intravenously.

Moderate Sedation Time: 25 minutes. The patient's level of
consciousness and vital signs were monitored continuously by
radiology nursing throughout the procedure under my direct
supervision.

FLUOROSCOPY TIME:  2 minutes, 24 seconds (56 mGy)

COMPLICATIONS:
None immediate.

PROCEDURE:
Informed written consent was obtained from the patient after a
discussion of the risk, benefits and alternatives to treatment.
Questions regarding the procedure were encouraged and answered. A
timeout was performed prior to the initiation of the procedure.

The skin overlying the left upper arm dialysis fistula was prepped
with Betadine in a sterile fashion, and a sterile drape was applied
covering the operative field. A diagnostic shunt study was performed
via an 18 gauge angiocatheter introduced into venous outflow. Venous
drainage was assessed to the level of the central veins in the
chest. Proximal shunt was studied by reflux maneuver with temporary
compression of venous outflow.

The angiocath was removed and replaced with a 7-French sheath over a
guidewire. Balloon angioplasty of a moderate length moderate
(approximately 50%) luminal narrowing involving the venous stick
zone of the fistula was sequentially balloon angioplastied at
multiple stations with a 7 mm x 4 cm Conquest balloon. Post
angioplasty fistulagram images were obtained.

Next, a recurrent focal moderate (approximate 50%) luminal narrowing
involving the central aspect of the previously placed axillary vein
stent was angioplastied at multiple stations with a 12 mm x 4 cm
atlas balloon. Post angioplasty fistulagram images were obtained.

Images reviewed in the procedure was terminated. The sheath was
removed and hemostasis obtained with application of a 3-0 Ethilon
pursestring suture which will be removed at the patient's next
dialysis session. A dressing was placed. The patient tolerated the
procedure well without immediate postprocedural complication.
FINDINGS: The patient's native left upper arm brachial- basilic AV fistula is
widely patent.

A moderate length, moderate (approximately 50%) luminal narrowing of
the venous stick zone of the fistula was angioplastied at multiple
stations to 7 mm in diameter without evidence of a residual
hemodynamically significant stenosis or complication, specifically,
no evidence of vessel dissection or contrast extravasation.

A focal moderate (approximately 50%) recurrent narrowing involving
the central aspect of the axillary vein stent was angioplastied to
12 mm in diameter with completion images demonstrating a technically
excellent result without residual stenosis or complication.

The remainder of the native AV fistula is widely patent to the level
of the superior aspect the right atrium. Reflux fistulagram
demonstrates wide patency of the arterial limb and anastomosis.
IMPRESSION: 1. Technically successful balloon angioplasty of moderate narrowing
of the venous stick zone of the native AV fistula to 7 mm diameter
without residual hemodynamically significant stenosis.
2. Technically successful balloon angioplasty of a focal moderate
narrowing involving the central aspect of the left axillary venous
stent to 12 mm in diameter without residual stenosis.

ACCESS:
This access remains amenable to future percutaneous interventions as
clinically indicated.

## 2018-02-08 DIAGNOSIS — N2581 Secondary hyperparathyroidism of renal origin: Secondary | ICD-10-CM | POA: Diagnosis not present

## 2018-02-08 DIAGNOSIS — Z992 Dependence on renal dialysis: Secondary | ICD-10-CM | POA: Diagnosis not present

## 2018-02-08 DIAGNOSIS — N186 End stage renal disease: Secondary | ICD-10-CM | POA: Diagnosis not present

## 2018-02-08 DIAGNOSIS — E611 Iron deficiency: Secondary | ICD-10-CM | POA: Diagnosis not present

## 2018-02-08 DIAGNOSIS — D509 Iron deficiency anemia, unspecified: Secondary | ICD-10-CM | POA: Diagnosis not present

## 2018-02-08 DIAGNOSIS — D631 Anemia in chronic kidney disease: Secondary | ICD-10-CM | POA: Diagnosis not present

## 2018-02-10 DIAGNOSIS — Z992 Dependence on renal dialysis: Secondary | ICD-10-CM | POA: Diagnosis not present

## 2018-02-10 DIAGNOSIS — N2581 Secondary hyperparathyroidism of renal origin: Secondary | ICD-10-CM | POA: Diagnosis not present

## 2018-02-10 DIAGNOSIS — D509 Iron deficiency anemia, unspecified: Secondary | ICD-10-CM | POA: Diagnosis not present

## 2018-02-10 DIAGNOSIS — D631 Anemia in chronic kidney disease: Secondary | ICD-10-CM | POA: Diagnosis not present

## 2018-02-10 DIAGNOSIS — N186 End stage renal disease: Secondary | ICD-10-CM | POA: Diagnosis not present

## 2018-02-10 DIAGNOSIS — E611 Iron deficiency: Secondary | ICD-10-CM | POA: Diagnosis not present

## 2018-02-12 DIAGNOSIS — Z992 Dependence on renal dialysis: Secondary | ICD-10-CM | POA: Diagnosis not present

## 2018-02-12 DIAGNOSIS — N186 End stage renal disease: Secondary | ICD-10-CM | POA: Diagnosis not present

## 2018-02-13 DIAGNOSIS — Z992 Dependence on renal dialysis: Secondary | ICD-10-CM | POA: Diagnosis not present

## 2018-02-13 DIAGNOSIS — N186 End stage renal disease: Secondary | ICD-10-CM | POA: Diagnosis not present

## 2018-02-13 DIAGNOSIS — D631 Anemia in chronic kidney disease: Secondary | ICD-10-CM | POA: Diagnosis not present

## 2018-02-13 DIAGNOSIS — E611 Iron deficiency: Secondary | ICD-10-CM | POA: Diagnosis not present

## 2018-02-13 DIAGNOSIS — N2581 Secondary hyperparathyroidism of renal origin: Secondary | ICD-10-CM | POA: Diagnosis not present

## 2018-02-15 DIAGNOSIS — N186 End stage renal disease: Secondary | ICD-10-CM | POA: Diagnosis not present

## 2018-02-15 DIAGNOSIS — N2581 Secondary hyperparathyroidism of renal origin: Secondary | ICD-10-CM | POA: Diagnosis not present

## 2018-02-15 DIAGNOSIS — D631 Anemia in chronic kidney disease: Secondary | ICD-10-CM | POA: Diagnosis not present

## 2018-02-15 DIAGNOSIS — Z992 Dependence on renal dialysis: Secondary | ICD-10-CM | POA: Diagnosis not present

## 2018-02-15 DIAGNOSIS — E611 Iron deficiency: Secondary | ICD-10-CM | POA: Diagnosis not present

## 2018-02-17 DIAGNOSIS — D631 Anemia in chronic kidney disease: Secondary | ICD-10-CM | POA: Diagnosis not present

## 2018-02-17 DIAGNOSIS — E611 Iron deficiency: Secondary | ICD-10-CM | POA: Diagnosis not present

## 2018-02-17 DIAGNOSIS — Z992 Dependence on renal dialysis: Secondary | ICD-10-CM | POA: Diagnosis not present

## 2018-02-17 DIAGNOSIS — N186 End stage renal disease: Secondary | ICD-10-CM | POA: Diagnosis not present

## 2018-02-17 DIAGNOSIS — N2581 Secondary hyperparathyroidism of renal origin: Secondary | ICD-10-CM | POA: Diagnosis not present

## 2018-02-20 DIAGNOSIS — N2581 Secondary hyperparathyroidism of renal origin: Secondary | ICD-10-CM | POA: Diagnosis not present

## 2018-02-20 DIAGNOSIS — D631 Anemia in chronic kidney disease: Secondary | ICD-10-CM | POA: Diagnosis not present

## 2018-02-20 DIAGNOSIS — Z992 Dependence on renal dialysis: Secondary | ICD-10-CM | POA: Diagnosis not present

## 2018-02-20 DIAGNOSIS — E611 Iron deficiency: Secondary | ICD-10-CM | POA: Diagnosis not present

## 2018-02-20 DIAGNOSIS — N186 End stage renal disease: Secondary | ICD-10-CM | POA: Diagnosis not present

## 2018-02-22 DIAGNOSIS — N2581 Secondary hyperparathyroidism of renal origin: Secondary | ICD-10-CM | POA: Diagnosis not present

## 2018-02-22 DIAGNOSIS — D631 Anemia in chronic kidney disease: Secondary | ICD-10-CM | POA: Diagnosis not present

## 2018-02-22 DIAGNOSIS — Z992 Dependence on renal dialysis: Secondary | ICD-10-CM | POA: Diagnosis not present

## 2018-02-22 DIAGNOSIS — N186 End stage renal disease: Secondary | ICD-10-CM | POA: Diagnosis not present

## 2018-02-22 DIAGNOSIS — E611 Iron deficiency: Secondary | ICD-10-CM | POA: Diagnosis not present

## 2018-02-24 DIAGNOSIS — N2581 Secondary hyperparathyroidism of renal origin: Secondary | ICD-10-CM | POA: Diagnosis not present

## 2018-02-24 DIAGNOSIS — N186 End stage renal disease: Secondary | ICD-10-CM | POA: Diagnosis not present

## 2018-02-24 DIAGNOSIS — Z992 Dependence on renal dialysis: Secondary | ICD-10-CM | POA: Diagnosis not present

## 2018-02-24 DIAGNOSIS — E611 Iron deficiency: Secondary | ICD-10-CM | POA: Diagnosis not present

## 2018-02-24 DIAGNOSIS — D631 Anemia in chronic kidney disease: Secondary | ICD-10-CM | POA: Diagnosis not present

## 2018-02-27 DIAGNOSIS — E611 Iron deficiency: Secondary | ICD-10-CM | POA: Diagnosis not present

## 2018-02-27 DIAGNOSIS — N186 End stage renal disease: Secondary | ICD-10-CM | POA: Diagnosis not present

## 2018-02-27 DIAGNOSIS — D631 Anemia in chronic kidney disease: Secondary | ICD-10-CM | POA: Diagnosis not present

## 2018-02-27 DIAGNOSIS — N2581 Secondary hyperparathyroidism of renal origin: Secondary | ICD-10-CM | POA: Diagnosis not present

## 2018-02-27 DIAGNOSIS — Z992 Dependence on renal dialysis: Secondary | ICD-10-CM | POA: Diagnosis not present

## 2018-03-01 DIAGNOSIS — N186 End stage renal disease: Secondary | ICD-10-CM | POA: Diagnosis not present

## 2018-03-01 DIAGNOSIS — Z992 Dependence on renal dialysis: Secondary | ICD-10-CM | POA: Diagnosis not present

## 2018-03-01 DIAGNOSIS — D631 Anemia in chronic kidney disease: Secondary | ICD-10-CM | POA: Diagnosis not present

## 2018-03-01 DIAGNOSIS — E611 Iron deficiency: Secondary | ICD-10-CM | POA: Diagnosis not present

## 2018-03-01 DIAGNOSIS — N2581 Secondary hyperparathyroidism of renal origin: Secondary | ICD-10-CM | POA: Diagnosis not present

## 2018-03-03 DIAGNOSIS — Z992 Dependence on renal dialysis: Secondary | ICD-10-CM | POA: Diagnosis not present

## 2018-03-03 DIAGNOSIS — D631 Anemia in chronic kidney disease: Secondary | ICD-10-CM | POA: Diagnosis not present

## 2018-03-03 DIAGNOSIS — E611 Iron deficiency: Secondary | ICD-10-CM | POA: Diagnosis not present

## 2018-03-03 DIAGNOSIS — N2581 Secondary hyperparathyroidism of renal origin: Secondary | ICD-10-CM | POA: Diagnosis not present

## 2018-03-03 DIAGNOSIS — N186 End stage renal disease: Secondary | ICD-10-CM | POA: Diagnosis not present

## 2018-03-06 DIAGNOSIS — Z992 Dependence on renal dialysis: Secondary | ICD-10-CM | POA: Diagnosis not present

## 2018-03-06 DIAGNOSIS — E611 Iron deficiency: Secondary | ICD-10-CM | POA: Diagnosis not present

## 2018-03-06 DIAGNOSIS — D631 Anemia in chronic kidney disease: Secondary | ICD-10-CM | POA: Diagnosis not present

## 2018-03-06 DIAGNOSIS — N2581 Secondary hyperparathyroidism of renal origin: Secondary | ICD-10-CM | POA: Diagnosis not present

## 2018-03-06 DIAGNOSIS — N186 End stage renal disease: Secondary | ICD-10-CM | POA: Diagnosis not present

## 2018-03-08 DIAGNOSIS — E611 Iron deficiency: Secondary | ICD-10-CM | POA: Diagnosis not present

## 2018-03-08 DIAGNOSIS — N2581 Secondary hyperparathyroidism of renal origin: Secondary | ICD-10-CM | POA: Diagnosis not present

## 2018-03-08 DIAGNOSIS — D631 Anemia in chronic kidney disease: Secondary | ICD-10-CM | POA: Diagnosis not present

## 2018-03-08 DIAGNOSIS — Z992 Dependence on renal dialysis: Secondary | ICD-10-CM | POA: Diagnosis not present

## 2018-03-08 DIAGNOSIS — N186 End stage renal disease: Secondary | ICD-10-CM | POA: Diagnosis not present

## 2018-03-10 DIAGNOSIS — Z992 Dependence on renal dialysis: Secondary | ICD-10-CM | POA: Diagnosis not present

## 2018-03-10 DIAGNOSIS — D631 Anemia in chronic kidney disease: Secondary | ICD-10-CM | POA: Diagnosis not present

## 2018-03-10 DIAGNOSIS — E611 Iron deficiency: Secondary | ICD-10-CM | POA: Diagnosis not present

## 2018-03-10 DIAGNOSIS — N2581 Secondary hyperparathyroidism of renal origin: Secondary | ICD-10-CM | POA: Diagnosis not present

## 2018-03-10 DIAGNOSIS — N186 End stage renal disease: Secondary | ICD-10-CM | POA: Diagnosis not present

## 2018-03-13 DIAGNOSIS — Z992 Dependence on renal dialysis: Secondary | ICD-10-CM | POA: Diagnosis not present

## 2018-03-13 DIAGNOSIS — D631 Anemia in chronic kidney disease: Secondary | ICD-10-CM | POA: Diagnosis not present

## 2018-03-13 DIAGNOSIS — N186 End stage renal disease: Secondary | ICD-10-CM | POA: Diagnosis not present

## 2018-03-13 DIAGNOSIS — N2581 Secondary hyperparathyroidism of renal origin: Secondary | ICD-10-CM | POA: Diagnosis not present

## 2018-03-13 DIAGNOSIS — E611 Iron deficiency: Secondary | ICD-10-CM | POA: Diagnosis not present

## 2018-03-14 DIAGNOSIS — Z992 Dependence on renal dialysis: Secondary | ICD-10-CM | POA: Diagnosis not present

## 2018-03-14 DIAGNOSIS — N186 End stage renal disease: Secondary | ICD-10-CM | POA: Diagnosis not present

## 2018-03-15 DIAGNOSIS — N2581 Secondary hyperparathyroidism of renal origin: Secondary | ICD-10-CM | POA: Diagnosis not present

## 2018-03-15 DIAGNOSIS — Z992 Dependence on renal dialysis: Secondary | ICD-10-CM | POA: Diagnosis not present

## 2018-03-15 DIAGNOSIS — D631 Anemia in chronic kidney disease: Secondary | ICD-10-CM | POA: Diagnosis not present

## 2018-03-15 DIAGNOSIS — N186 End stage renal disease: Secondary | ICD-10-CM | POA: Diagnosis not present

## 2018-03-15 DIAGNOSIS — E611 Iron deficiency: Secondary | ICD-10-CM | POA: Diagnosis not present

## 2018-03-17 DIAGNOSIS — N186 End stage renal disease: Secondary | ICD-10-CM | POA: Diagnosis not present

## 2018-03-17 DIAGNOSIS — E611 Iron deficiency: Secondary | ICD-10-CM | POA: Diagnosis not present

## 2018-03-17 DIAGNOSIS — N2581 Secondary hyperparathyroidism of renal origin: Secondary | ICD-10-CM | POA: Diagnosis not present

## 2018-03-17 DIAGNOSIS — D631 Anemia in chronic kidney disease: Secondary | ICD-10-CM | POA: Diagnosis not present

## 2018-03-17 DIAGNOSIS — Z992 Dependence on renal dialysis: Secondary | ICD-10-CM | POA: Diagnosis not present

## 2018-03-20 DIAGNOSIS — N186 End stage renal disease: Secondary | ICD-10-CM | POA: Diagnosis not present

## 2018-03-20 DIAGNOSIS — Z992 Dependence on renal dialysis: Secondary | ICD-10-CM | POA: Diagnosis not present

## 2018-03-20 DIAGNOSIS — E611 Iron deficiency: Secondary | ICD-10-CM | POA: Diagnosis not present

## 2018-03-20 DIAGNOSIS — D631 Anemia in chronic kidney disease: Secondary | ICD-10-CM | POA: Diagnosis not present

## 2018-03-20 DIAGNOSIS — N2581 Secondary hyperparathyroidism of renal origin: Secondary | ICD-10-CM | POA: Diagnosis not present

## 2018-03-22 DIAGNOSIS — D631 Anemia in chronic kidney disease: Secondary | ICD-10-CM | POA: Diagnosis not present

## 2018-03-22 DIAGNOSIS — N186 End stage renal disease: Secondary | ICD-10-CM | POA: Diagnosis not present

## 2018-03-22 DIAGNOSIS — N2581 Secondary hyperparathyroidism of renal origin: Secondary | ICD-10-CM | POA: Diagnosis not present

## 2018-03-22 DIAGNOSIS — Z992 Dependence on renal dialysis: Secondary | ICD-10-CM | POA: Diagnosis not present

## 2018-03-22 DIAGNOSIS — E611 Iron deficiency: Secondary | ICD-10-CM | POA: Diagnosis not present

## 2018-03-24 DIAGNOSIS — N2581 Secondary hyperparathyroidism of renal origin: Secondary | ICD-10-CM | POA: Diagnosis not present

## 2018-03-24 DIAGNOSIS — N186 End stage renal disease: Secondary | ICD-10-CM | POA: Diagnosis not present

## 2018-03-24 DIAGNOSIS — D631 Anemia in chronic kidney disease: Secondary | ICD-10-CM | POA: Diagnosis not present

## 2018-03-24 DIAGNOSIS — E611 Iron deficiency: Secondary | ICD-10-CM | POA: Diagnosis not present

## 2018-03-24 DIAGNOSIS — Z992 Dependence on renal dialysis: Secondary | ICD-10-CM | POA: Diagnosis not present

## 2018-03-27 DIAGNOSIS — Z992 Dependence on renal dialysis: Secondary | ICD-10-CM | POA: Diagnosis not present

## 2018-03-27 DIAGNOSIS — E611 Iron deficiency: Secondary | ICD-10-CM | POA: Diagnosis not present

## 2018-03-27 DIAGNOSIS — D631 Anemia in chronic kidney disease: Secondary | ICD-10-CM | POA: Diagnosis not present

## 2018-03-27 DIAGNOSIS — N2581 Secondary hyperparathyroidism of renal origin: Secondary | ICD-10-CM | POA: Diagnosis not present

## 2018-03-27 DIAGNOSIS — N186 End stage renal disease: Secondary | ICD-10-CM | POA: Diagnosis not present

## 2018-03-29 DIAGNOSIS — N186 End stage renal disease: Secondary | ICD-10-CM | POA: Diagnosis not present

## 2018-03-29 DIAGNOSIS — E611 Iron deficiency: Secondary | ICD-10-CM | POA: Diagnosis not present

## 2018-03-29 DIAGNOSIS — Z992 Dependence on renal dialysis: Secondary | ICD-10-CM | POA: Diagnosis not present

## 2018-03-29 DIAGNOSIS — N2581 Secondary hyperparathyroidism of renal origin: Secondary | ICD-10-CM | POA: Diagnosis not present

## 2018-03-29 DIAGNOSIS — D631 Anemia in chronic kidney disease: Secondary | ICD-10-CM | POA: Diagnosis not present

## 2018-03-31 DIAGNOSIS — N2581 Secondary hyperparathyroidism of renal origin: Secondary | ICD-10-CM | POA: Diagnosis not present

## 2018-03-31 DIAGNOSIS — Z992 Dependence on renal dialysis: Secondary | ICD-10-CM | POA: Diagnosis not present

## 2018-03-31 DIAGNOSIS — E611 Iron deficiency: Secondary | ICD-10-CM | POA: Diagnosis not present

## 2018-03-31 DIAGNOSIS — N186 End stage renal disease: Secondary | ICD-10-CM | POA: Diagnosis not present

## 2018-03-31 DIAGNOSIS — D631 Anemia in chronic kidney disease: Secondary | ICD-10-CM | POA: Diagnosis not present

## 2018-04-03 DIAGNOSIS — N186 End stage renal disease: Secondary | ICD-10-CM | POA: Diagnosis not present

## 2018-04-03 DIAGNOSIS — E611 Iron deficiency: Secondary | ICD-10-CM | POA: Diagnosis not present

## 2018-04-03 DIAGNOSIS — D631 Anemia in chronic kidney disease: Secondary | ICD-10-CM | POA: Diagnosis not present

## 2018-04-03 DIAGNOSIS — Z992 Dependence on renal dialysis: Secondary | ICD-10-CM | POA: Diagnosis not present

## 2018-04-03 DIAGNOSIS — N2581 Secondary hyperparathyroidism of renal origin: Secondary | ICD-10-CM | POA: Diagnosis not present

## 2018-04-05 DIAGNOSIS — D631 Anemia in chronic kidney disease: Secondary | ICD-10-CM | POA: Diagnosis not present

## 2018-04-05 DIAGNOSIS — N186 End stage renal disease: Secondary | ICD-10-CM | POA: Diagnosis not present

## 2018-04-05 DIAGNOSIS — Z992 Dependence on renal dialysis: Secondary | ICD-10-CM | POA: Diagnosis not present

## 2018-04-05 DIAGNOSIS — N2581 Secondary hyperparathyroidism of renal origin: Secondary | ICD-10-CM | POA: Diagnosis not present

## 2018-04-05 DIAGNOSIS — E611 Iron deficiency: Secondary | ICD-10-CM | POA: Diagnosis not present

## 2018-04-07 DIAGNOSIS — E611 Iron deficiency: Secondary | ICD-10-CM | POA: Diagnosis not present

## 2018-04-07 DIAGNOSIS — D631 Anemia in chronic kidney disease: Secondary | ICD-10-CM | POA: Diagnosis not present

## 2018-04-07 DIAGNOSIS — N186 End stage renal disease: Secondary | ICD-10-CM | POA: Diagnosis not present

## 2018-04-07 DIAGNOSIS — N2581 Secondary hyperparathyroidism of renal origin: Secondary | ICD-10-CM | POA: Diagnosis not present

## 2018-04-07 DIAGNOSIS — Z992 Dependence on renal dialysis: Secondary | ICD-10-CM | POA: Diagnosis not present

## 2018-04-10 DIAGNOSIS — N2581 Secondary hyperparathyroidism of renal origin: Secondary | ICD-10-CM | POA: Diagnosis not present

## 2018-04-10 DIAGNOSIS — E611 Iron deficiency: Secondary | ICD-10-CM | POA: Diagnosis not present

## 2018-04-10 DIAGNOSIS — Z992 Dependence on renal dialysis: Secondary | ICD-10-CM | POA: Diagnosis not present

## 2018-04-10 DIAGNOSIS — D631 Anemia in chronic kidney disease: Secondary | ICD-10-CM | POA: Diagnosis not present

## 2018-04-10 DIAGNOSIS — N186 End stage renal disease: Secondary | ICD-10-CM | POA: Diagnosis not present

## 2018-04-12 DIAGNOSIS — N186 End stage renal disease: Secondary | ICD-10-CM | POA: Diagnosis not present

## 2018-04-12 DIAGNOSIS — E611 Iron deficiency: Secondary | ICD-10-CM | POA: Diagnosis not present

## 2018-04-12 DIAGNOSIS — N2581 Secondary hyperparathyroidism of renal origin: Secondary | ICD-10-CM | POA: Diagnosis not present

## 2018-04-12 DIAGNOSIS — D631 Anemia in chronic kidney disease: Secondary | ICD-10-CM | POA: Diagnosis not present

## 2018-04-12 DIAGNOSIS — Z992 Dependence on renal dialysis: Secondary | ICD-10-CM | POA: Diagnosis not present

## 2018-04-14 DIAGNOSIS — E611 Iron deficiency: Secondary | ICD-10-CM | POA: Diagnosis not present

## 2018-04-14 DIAGNOSIS — D631 Anemia in chronic kidney disease: Secondary | ICD-10-CM | POA: Diagnosis not present

## 2018-04-14 DIAGNOSIS — Z992 Dependence on renal dialysis: Secondary | ICD-10-CM | POA: Diagnosis not present

## 2018-04-14 DIAGNOSIS — N2581 Secondary hyperparathyroidism of renal origin: Secondary | ICD-10-CM | POA: Diagnosis not present

## 2018-04-14 DIAGNOSIS — N186 End stage renal disease: Secondary | ICD-10-CM | POA: Diagnosis not present

## 2018-04-17 DIAGNOSIS — D509 Iron deficiency anemia, unspecified: Secondary | ICD-10-CM | POA: Diagnosis not present

## 2018-04-17 DIAGNOSIS — Z992 Dependence on renal dialysis: Secondary | ICD-10-CM | POA: Diagnosis not present

## 2018-04-17 DIAGNOSIS — N2581 Secondary hyperparathyroidism of renal origin: Secondary | ICD-10-CM | POA: Diagnosis not present

## 2018-04-17 DIAGNOSIS — N186 End stage renal disease: Secondary | ICD-10-CM | POA: Diagnosis not present

## 2018-04-17 DIAGNOSIS — E611 Iron deficiency: Secondary | ICD-10-CM | POA: Diagnosis not present

## 2018-04-17 DIAGNOSIS — D631 Anemia in chronic kidney disease: Secondary | ICD-10-CM | POA: Diagnosis not present

## 2018-04-19 DIAGNOSIS — Z992 Dependence on renal dialysis: Secondary | ICD-10-CM | POA: Diagnosis not present

## 2018-04-19 DIAGNOSIS — D509 Iron deficiency anemia, unspecified: Secondary | ICD-10-CM | POA: Diagnosis not present

## 2018-04-19 DIAGNOSIS — E611 Iron deficiency: Secondary | ICD-10-CM | POA: Diagnosis not present

## 2018-04-19 DIAGNOSIS — D631 Anemia in chronic kidney disease: Secondary | ICD-10-CM | POA: Diagnosis not present

## 2018-04-19 DIAGNOSIS — N2581 Secondary hyperparathyroidism of renal origin: Secondary | ICD-10-CM | POA: Diagnosis not present

## 2018-04-19 DIAGNOSIS — N186 End stage renal disease: Secondary | ICD-10-CM | POA: Diagnosis not present

## 2018-04-21 DIAGNOSIS — N186 End stage renal disease: Secondary | ICD-10-CM | POA: Diagnosis not present

## 2018-04-21 DIAGNOSIS — Z992 Dependence on renal dialysis: Secondary | ICD-10-CM | POA: Diagnosis not present

## 2018-04-21 DIAGNOSIS — E611 Iron deficiency: Secondary | ICD-10-CM | POA: Diagnosis not present

## 2018-04-21 DIAGNOSIS — D631 Anemia in chronic kidney disease: Secondary | ICD-10-CM | POA: Diagnosis not present

## 2018-04-21 DIAGNOSIS — D509 Iron deficiency anemia, unspecified: Secondary | ICD-10-CM | POA: Diagnosis not present

## 2018-04-21 DIAGNOSIS — N2581 Secondary hyperparathyroidism of renal origin: Secondary | ICD-10-CM | POA: Diagnosis not present

## 2018-04-24 DIAGNOSIS — D631 Anemia in chronic kidney disease: Secondary | ICD-10-CM | POA: Diagnosis not present

## 2018-04-24 DIAGNOSIS — E611 Iron deficiency: Secondary | ICD-10-CM | POA: Diagnosis not present

## 2018-04-24 DIAGNOSIS — D509 Iron deficiency anemia, unspecified: Secondary | ICD-10-CM | POA: Diagnosis not present

## 2018-04-24 DIAGNOSIS — Z992 Dependence on renal dialysis: Secondary | ICD-10-CM | POA: Diagnosis not present

## 2018-04-24 DIAGNOSIS — N186 End stage renal disease: Secondary | ICD-10-CM | POA: Diagnosis not present

## 2018-04-24 DIAGNOSIS — N2581 Secondary hyperparathyroidism of renal origin: Secondary | ICD-10-CM | POA: Diagnosis not present

## 2018-04-26 DIAGNOSIS — N2581 Secondary hyperparathyroidism of renal origin: Secondary | ICD-10-CM | POA: Diagnosis not present

## 2018-04-26 DIAGNOSIS — D509 Iron deficiency anemia, unspecified: Secondary | ICD-10-CM | POA: Diagnosis not present

## 2018-04-26 DIAGNOSIS — E611 Iron deficiency: Secondary | ICD-10-CM | POA: Diagnosis not present

## 2018-04-26 DIAGNOSIS — N186 End stage renal disease: Secondary | ICD-10-CM | POA: Diagnosis not present

## 2018-04-26 DIAGNOSIS — D631 Anemia in chronic kidney disease: Secondary | ICD-10-CM | POA: Diagnosis not present

## 2018-04-26 DIAGNOSIS — Z992 Dependence on renal dialysis: Secondary | ICD-10-CM | POA: Diagnosis not present

## 2018-04-28 DIAGNOSIS — E611 Iron deficiency: Secondary | ICD-10-CM | POA: Diagnosis not present

## 2018-04-28 DIAGNOSIS — D631 Anemia in chronic kidney disease: Secondary | ICD-10-CM | POA: Diagnosis not present

## 2018-04-28 DIAGNOSIS — Z992 Dependence on renal dialysis: Secondary | ICD-10-CM | POA: Diagnosis not present

## 2018-04-28 DIAGNOSIS — N186 End stage renal disease: Secondary | ICD-10-CM | POA: Diagnosis not present

## 2018-04-28 DIAGNOSIS — D509 Iron deficiency anemia, unspecified: Secondary | ICD-10-CM | POA: Diagnosis not present

## 2018-04-28 DIAGNOSIS — N2581 Secondary hyperparathyroidism of renal origin: Secondary | ICD-10-CM | POA: Diagnosis not present

## 2018-05-01 DIAGNOSIS — N186 End stage renal disease: Secondary | ICD-10-CM | POA: Diagnosis not present

## 2018-05-01 DIAGNOSIS — Z992 Dependence on renal dialysis: Secondary | ICD-10-CM | POA: Diagnosis not present

## 2018-05-01 DIAGNOSIS — D631 Anemia in chronic kidney disease: Secondary | ICD-10-CM | POA: Diagnosis not present

## 2018-05-01 DIAGNOSIS — D509 Iron deficiency anemia, unspecified: Secondary | ICD-10-CM | POA: Diagnosis not present

## 2018-05-01 DIAGNOSIS — E611 Iron deficiency: Secondary | ICD-10-CM | POA: Diagnosis not present

## 2018-05-01 DIAGNOSIS — N2581 Secondary hyperparathyroidism of renal origin: Secondary | ICD-10-CM | POA: Diagnosis not present

## 2018-05-05 DIAGNOSIS — N186 End stage renal disease: Secondary | ICD-10-CM | POA: Diagnosis not present

## 2018-05-05 DIAGNOSIS — D631 Anemia in chronic kidney disease: Secondary | ICD-10-CM | POA: Diagnosis not present

## 2018-05-05 DIAGNOSIS — D509 Iron deficiency anemia, unspecified: Secondary | ICD-10-CM | POA: Diagnosis not present

## 2018-05-05 DIAGNOSIS — E611 Iron deficiency: Secondary | ICD-10-CM | POA: Diagnosis not present

## 2018-05-05 DIAGNOSIS — N2581 Secondary hyperparathyroidism of renal origin: Secondary | ICD-10-CM | POA: Diagnosis not present

## 2018-05-05 DIAGNOSIS — Z992 Dependence on renal dialysis: Secondary | ICD-10-CM | POA: Diagnosis not present

## 2018-05-08 DIAGNOSIS — E611 Iron deficiency: Secondary | ICD-10-CM | POA: Diagnosis not present

## 2018-05-08 DIAGNOSIS — D631 Anemia in chronic kidney disease: Secondary | ICD-10-CM | POA: Diagnosis not present

## 2018-05-08 DIAGNOSIS — N2581 Secondary hyperparathyroidism of renal origin: Secondary | ICD-10-CM | POA: Diagnosis not present

## 2018-05-08 DIAGNOSIS — D509 Iron deficiency anemia, unspecified: Secondary | ICD-10-CM | POA: Diagnosis not present

## 2018-05-08 DIAGNOSIS — Z992 Dependence on renal dialysis: Secondary | ICD-10-CM | POA: Diagnosis not present

## 2018-05-08 DIAGNOSIS — N186 End stage renal disease: Secondary | ICD-10-CM | POA: Diagnosis not present

## 2018-05-10 DIAGNOSIS — Z992 Dependence on renal dialysis: Secondary | ICD-10-CM | POA: Diagnosis not present

## 2018-05-10 DIAGNOSIS — E611 Iron deficiency: Secondary | ICD-10-CM | POA: Diagnosis not present

## 2018-05-10 DIAGNOSIS — D509 Iron deficiency anemia, unspecified: Secondary | ICD-10-CM | POA: Diagnosis not present

## 2018-05-10 DIAGNOSIS — D631 Anemia in chronic kidney disease: Secondary | ICD-10-CM | POA: Diagnosis not present

## 2018-05-10 DIAGNOSIS — N186 End stage renal disease: Secondary | ICD-10-CM | POA: Diagnosis not present

## 2018-05-10 DIAGNOSIS — N2581 Secondary hyperparathyroidism of renal origin: Secondary | ICD-10-CM | POA: Diagnosis not present

## 2018-05-12 DIAGNOSIS — D509 Iron deficiency anemia, unspecified: Secondary | ICD-10-CM | POA: Diagnosis not present

## 2018-05-12 DIAGNOSIS — N2581 Secondary hyperparathyroidism of renal origin: Secondary | ICD-10-CM | POA: Diagnosis not present

## 2018-05-12 DIAGNOSIS — N186 End stage renal disease: Secondary | ICD-10-CM | POA: Diagnosis not present

## 2018-05-12 DIAGNOSIS — Z992 Dependence on renal dialysis: Secondary | ICD-10-CM | POA: Diagnosis not present

## 2018-05-12 DIAGNOSIS — E611 Iron deficiency: Secondary | ICD-10-CM | POA: Diagnosis not present

## 2018-05-12 DIAGNOSIS — D631 Anemia in chronic kidney disease: Secondary | ICD-10-CM | POA: Diagnosis not present

## 2018-05-14 DIAGNOSIS — Z992 Dependence on renal dialysis: Secondary | ICD-10-CM | POA: Diagnosis not present

## 2018-05-14 DIAGNOSIS — N186 End stage renal disease: Secondary | ICD-10-CM | POA: Diagnosis not present

## 2018-05-15 DIAGNOSIS — N186 End stage renal disease: Secondary | ICD-10-CM | POA: Diagnosis not present

## 2018-05-15 DIAGNOSIS — E611 Iron deficiency: Secondary | ICD-10-CM | POA: Diagnosis not present

## 2018-05-15 DIAGNOSIS — Z992 Dependence on renal dialysis: Secondary | ICD-10-CM | POA: Diagnosis not present

## 2018-05-15 DIAGNOSIS — D631 Anemia in chronic kidney disease: Secondary | ICD-10-CM | POA: Diagnosis not present

## 2018-05-15 DIAGNOSIS — N2581 Secondary hyperparathyroidism of renal origin: Secondary | ICD-10-CM | POA: Diagnosis not present

## 2018-05-17 DIAGNOSIS — Z992 Dependence on renal dialysis: Secondary | ICD-10-CM | POA: Diagnosis not present

## 2018-05-17 DIAGNOSIS — E611 Iron deficiency: Secondary | ICD-10-CM | POA: Diagnosis not present

## 2018-05-17 DIAGNOSIS — N2581 Secondary hyperparathyroidism of renal origin: Secondary | ICD-10-CM | POA: Diagnosis not present

## 2018-05-17 DIAGNOSIS — D631 Anemia in chronic kidney disease: Secondary | ICD-10-CM | POA: Diagnosis not present

## 2018-05-17 DIAGNOSIS — N186 End stage renal disease: Secondary | ICD-10-CM | POA: Diagnosis not present

## 2018-05-19 DIAGNOSIS — N186 End stage renal disease: Secondary | ICD-10-CM | POA: Diagnosis not present

## 2018-05-19 DIAGNOSIS — D631 Anemia in chronic kidney disease: Secondary | ICD-10-CM | POA: Diagnosis not present

## 2018-05-19 DIAGNOSIS — E611 Iron deficiency: Secondary | ICD-10-CM | POA: Diagnosis not present

## 2018-05-19 DIAGNOSIS — N2581 Secondary hyperparathyroidism of renal origin: Secondary | ICD-10-CM | POA: Diagnosis not present

## 2018-05-19 DIAGNOSIS — Z992 Dependence on renal dialysis: Secondary | ICD-10-CM | POA: Diagnosis not present

## 2018-05-22 DIAGNOSIS — Z992 Dependence on renal dialysis: Secondary | ICD-10-CM | POA: Diagnosis not present

## 2018-05-22 DIAGNOSIS — N2581 Secondary hyperparathyroidism of renal origin: Secondary | ICD-10-CM | POA: Diagnosis not present

## 2018-05-22 DIAGNOSIS — D631 Anemia in chronic kidney disease: Secondary | ICD-10-CM | POA: Diagnosis not present

## 2018-05-22 DIAGNOSIS — E611 Iron deficiency: Secondary | ICD-10-CM | POA: Diagnosis not present

## 2018-05-22 DIAGNOSIS — N186 End stage renal disease: Secondary | ICD-10-CM | POA: Diagnosis not present

## 2018-05-23 DIAGNOSIS — Z7682 Awaiting organ transplant status: Secondary | ICD-10-CM | POA: Diagnosis not present

## 2018-05-24 DIAGNOSIS — N2581 Secondary hyperparathyroidism of renal origin: Secondary | ICD-10-CM | POA: Diagnosis not present

## 2018-05-24 DIAGNOSIS — Z7682 Awaiting organ transplant status: Secondary | ICD-10-CM | POA: Diagnosis not present

## 2018-05-24 DIAGNOSIS — E611 Iron deficiency: Secondary | ICD-10-CM | POA: Diagnosis not present

## 2018-05-24 DIAGNOSIS — Z992 Dependence on renal dialysis: Secondary | ICD-10-CM | POA: Diagnosis not present

## 2018-05-24 DIAGNOSIS — N186 End stage renal disease: Secondary | ICD-10-CM | POA: Diagnosis not present

## 2018-05-24 DIAGNOSIS — D631 Anemia in chronic kidney disease: Secondary | ICD-10-CM | POA: Diagnosis not present

## 2018-05-26 DIAGNOSIS — N186 End stage renal disease: Secondary | ICD-10-CM | POA: Diagnosis not present

## 2018-05-26 DIAGNOSIS — E611 Iron deficiency: Secondary | ICD-10-CM | POA: Diagnosis not present

## 2018-05-26 DIAGNOSIS — N2581 Secondary hyperparathyroidism of renal origin: Secondary | ICD-10-CM | POA: Diagnosis not present

## 2018-05-26 DIAGNOSIS — D631 Anemia in chronic kidney disease: Secondary | ICD-10-CM | POA: Diagnosis not present

## 2018-05-26 DIAGNOSIS — Z992 Dependence on renal dialysis: Secondary | ICD-10-CM | POA: Diagnosis not present

## 2018-05-29 DIAGNOSIS — E611 Iron deficiency: Secondary | ICD-10-CM | POA: Diagnosis not present

## 2018-05-29 DIAGNOSIS — Z992 Dependence on renal dialysis: Secondary | ICD-10-CM | POA: Diagnosis not present

## 2018-05-29 DIAGNOSIS — N186 End stage renal disease: Secondary | ICD-10-CM | POA: Diagnosis not present

## 2018-05-29 DIAGNOSIS — D631 Anemia in chronic kidney disease: Secondary | ICD-10-CM | POA: Diagnosis not present

## 2018-05-29 DIAGNOSIS — N2581 Secondary hyperparathyroidism of renal origin: Secondary | ICD-10-CM | POA: Diagnosis not present

## 2018-05-31 DIAGNOSIS — N2581 Secondary hyperparathyroidism of renal origin: Secondary | ICD-10-CM | POA: Diagnosis not present

## 2018-05-31 DIAGNOSIS — Z992 Dependence on renal dialysis: Secondary | ICD-10-CM | POA: Diagnosis not present

## 2018-05-31 DIAGNOSIS — N186 End stage renal disease: Secondary | ICD-10-CM | POA: Diagnosis not present

## 2018-05-31 DIAGNOSIS — E611 Iron deficiency: Secondary | ICD-10-CM | POA: Diagnosis not present

## 2018-05-31 DIAGNOSIS — D631 Anemia in chronic kidney disease: Secondary | ICD-10-CM | POA: Diagnosis not present

## 2018-06-02 DIAGNOSIS — D631 Anemia in chronic kidney disease: Secondary | ICD-10-CM | POA: Diagnosis not present

## 2018-06-02 DIAGNOSIS — N186 End stage renal disease: Secondary | ICD-10-CM | POA: Diagnosis not present

## 2018-06-02 DIAGNOSIS — E611 Iron deficiency: Secondary | ICD-10-CM | POA: Diagnosis not present

## 2018-06-02 DIAGNOSIS — N2581 Secondary hyperparathyroidism of renal origin: Secondary | ICD-10-CM | POA: Diagnosis not present

## 2018-06-02 DIAGNOSIS — Z992 Dependence on renal dialysis: Secondary | ICD-10-CM | POA: Diagnosis not present

## 2018-06-05 DIAGNOSIS — Z992 Dependence on renal dialysis: Secondary | ICD-10-CM | POA: Diagnosis not present

## 2018-06-05 DIAGNOSIS — E611 Iron deficiency: Secondary | ICD-10-CM | POA: Diagnosis not present

## 2018-06-05 DIAGNOSIS — N186 End stage renal disease: Secondary | ICD-10-CM | POA: Diagnosis not present

## 2018-06-05 DIAGNOSIS — N2581 Secondary hyperparathyroidism of renal origin: Secondary | ICD-10-CM | POA: Diagnosis not present

## 2018-06-05 DIAGNOSIS — D631 Anemia in chronic kidney disease: Secondary | ICD-10-CM | POA: Diagnosis not present

## 2018-06-07 DIAGNOSIS — E611 Iron deficiency: Secondary | ICD-10-CM | POA: Diagnosis not present

## 2018-06-07 DIAGNOSIS — N2581 Secondary hyperparathyroidism of renal origin: Secondary | ICD-10-CM | POA: Diagnosis not present

## 2018-06-07 DIAGNOSIS — D631 Anemia in chronic kidney disease: Secondary | ICD-10-CM | POA: Diagnosis not present

## 2018-06-07 DIAGNOSIS — Z992 Dependence on renal dialysis: Secondary | ICD-10-CM | POA: Diagnosis not present

## 2018-06-07 DIAGNOSIS — N186 End stage renal disease: Secondary | ICD-10-CM | POA: Diagnosis not present

## 2018-06-09 DIAGNOSIS — E611 Iron deficiency: Secondary | ICD-10-CM | POA: Diagnosis not present

## 2018-06-09 DIAGNOSIS — N2581 Secondary hyperparathyroidism of renal origin: Secondary | ICD-10-CM | POA: Diagnosis not present

## 2018-06-09 DIAGNOSIS — Z992 Dependence on renal dialysis: Secondary | ICD-10-CM | POA: Diagnosis not present

## 2018-06-09 DIAGNOSIS — D631 Anemia in chronic kidney disease: Secondary | ICD-10-CM | POA: Diagnosis not present

## 2018-06-09 DIAGNOSIS — N186 End stage renal disease: Secondary | ICD-10-CM | POA: Diagnosis not present

## 2018-06-12 DIAGNOSIS — D631 Anemia in chronic kidney disease: Secondary | ICD-10-CM | POA: Diagnosis not present

## 2018-06-12 DIAGNOSIS — E611 Iron deficiency: Secondary | ICD-10-CM | POA: Diagnosis not present

## 2018-06-12 DIAGNOSIS — Z992 Dependence on renal dialysis: Secondary | ICD-10-CM | POA: Diagnosis not present

## 2018-06-12 DIAGNOSIS — N2581 Secondary hyperparathyroidism of renal origin: Secondary | ICD-10-CM | POA: Diagnosis not present

## 2018-06-12 DIAGNOSIS — N186 End stage renal disease: Secondary | ICD-10-CM | POA: Diagnosis not present

## 2018-06-14 DIAGNOSIS — E611 Iron deficiency: Secondary | ICD-10-CM | POA: Diagnosis not present

## 2018-06-14 DIAGNOSIS — N2581 Secondary hyperparathyroidism of renal origin: Secondary | ICD-10-CM | POA: Diagnosis not present

## 2018-06-14 DIAGNOSIS — Z992 Dependence on renal dialysis: Secondary | ICD-10-CM | POA: Diagnosis not present

## 2018-06-14 DIAGNOSIS — N186 End stage renal disease: Secondary | ICD-10-CM | POA: Diagnosis not present

## 2018-06-14 DIAGNOSIS — D631 Anemia in chronic kidney disease: Secondary | ICD-10-CM | POA: Diagnosis not present

## 2018-06-16 DIAGNOSIS — D631 Anemia in chronic kidney disease: Secondary | ICD-10-CM | POA: Diagnosis not present

## 2018-06-16 DIAGNOSIS — N2581 Secondary hyperparathyroidism of renal origin: Secondary | ICD-10-CM | POA: Diagnosis not present

## 2018-06-16 DIAGNOSIS — N186 End stage renal disease: Secondary | ICD-10-CM | POA: Diagnosis not present

## 2018-06-16 DIAGNOSIS — Z992 Dependence on renal dialysis: Secondary | ICD-10-CM | POA: Diagnosis not present

## 2018-06-16 DIAGNOSIS — E611 Iron deficiency: Secondary | ICD-10-CM | POA: Diagnosis not present

## 2018-06-19 DIAGNOSIS — E611 Iron deficiency: Secondary | ICD-10-CM | POA: Diagnosis not present

## 2018-06-19 DIAGNOSIS — N2581 Secondary hyperparathyroidism of renal origin: Secondary | ICD-10-CM | POA: Diagnosis not present

## 2018-06-19 DIAGNOSIS — D631 Anemia in chronic kidney disease: Secondary | ICD-10-CM | POA: Diagnosis not present

## 2018-06-19 DIAGNOSIS — Z992 Dependence on renal dialysis: Secondary | ICD-10-CM | POA: Diagnosis not present

## 2018-06-19 DIAGNOSIS — N186 End stage renal disease: Secondary | ICD-10-CM | POA: Diagnosis not present

## 2018-06-21 DIAGNOSIS — Z992 Dependence on renal dialysis: Secondary | ICD-10-CM | POA: Diagnosis not present

## 2018-06-21 DIAGNOSIS — N186 End stage renal disease: Secondary | ICD-10-CM | POA: Diagnosis not present

## 2018-06-21 DIAGNOSIS — E611 Iron deficiency: Secondary | ICD-10-CM | POA: Diagnosis not present

## 2018-06-21 DIAGNOSIS — D631 Anemia in chronic kidney disease: Secondary | ICD-10-CM | POA: Diagnosis not present

## 2018-06-21 DIAGNOSIS — N2581 Secondary hyperparathyroidism of renal origin: Secondary | ICD-10-CM | POA: Diagnosis not present

## 2018-06-23 DIAGNOSIS — Z992 Dependence on renal dialysis: Secondary | ICD-10-CM | POA: Diagnosis not present

## 2018-06-23 DIAGNOSIS — N2581 Secondary hyperparathyroidism of renal origin: Secondary | ICD-10-CM | POA: Diagnosis not present

## 2018-06-23 DIAGNOSIS — N186 End stage renal disease: Secondary | ICD-10-CM | POA: Diagnosis not present

## 2018-06-23 DIAGNOSIS — E611 Iron deficiency: Secondary | ICD-10-CM | POA: Diagnosis not present

## 2018-06-23 DIAGNOSIS — D631 Anemia in chronic kidney disease: Secondary | ICD-10-CM | POA: Diagnosis not present

## 2018-06-26 DIAGNOSIS — D631 Anemia in chronic kidney disease: Secondary | ICD-10-CM | POA: Diagnosis not present

## 2018-06-26 DIAGNOSIS — E611 Iron deficiency: Secondary | ICD-10-CM | POA: Diagnosis not present

## 2018-06-26 DIAGNOSIS — Z992 Dependence on renal dialysis: Secondary | ICD-10-CM | POA: Diagnosis not present

## 2018-06-26 DIAGNOSIS — N2581 Secondary hyperparathyroidism of renal origin: Secondary | ICD-10-CM | POA: Diagnosis not present

## 2018-06-26 DIAGNOSIS — N186 End stage renal disease: Secondary | ICD-10-CM | POA: Diagnosis not present

## 2018-06-28 DIAGNOSIS — Z992 Dependence on renal dialysis: Secondary | ICD-10-CM | POA: Diagnosis not present

## 2018-06-28 DIAGNOSIS — N186 End stage renal disease: Secondary | ICD-10-CM | POA: Diagnosis not present

## 2018-06-28 DIAGNOSIS — N2581 Secondary hyperparathyroidism of renal origin: Secondary | ICD-10-CM | POA: Diagnosis not present

## 2018-06-28 DIAGNOSIS — E611 Iron deficiency: Secondary | ICD-10-CM | POA: Diagnosis not present

## 2018-06-28 DIAGNOSIS — D631 Anemia in chronic kidney disease: Secondary | ICD-10-CM | POA: Diagnosis not present

## 2018-06-30 DIAGNOSIS — E611 Iron deficiency: Secondary | ICD-10-CM | POA: Diagnosis not present

## 2018-06-30 DIAGNOSIS — N2581 Secondary hyperparathyroidism of renal origin: Secondary | ICD-10-CM | POA: Diagnosis not present

## 2018-06-30 DIAGNOSIS — D631 Anemia in chronic kidney disease: Secondary | ICD-10-CM | POA: Diagnosis not present

## 2018-06-30 DIAGNOSIS — N186 End stage renal disease: Secondary | ICD-10-CM | POA: Diagnosis not present

## 2018-06-30 DIAGNOSIS — Z992 Dependence on renal dialysis: Secondary | ICD-10-CM | POA: Diagnosis not present

## 2018-07-03 DIAGNOSIS — E611 Iron deficiency: Secondary | ICD-10-CM | POA: Diagnosis not present

## 2018-07-03 DIAGNOSIS — N186 End stage renal disease: Secondary | ICD-10-CM | POA: Diagnosis not present

## 2018-07-03 DIAGNOSIS — Z992 Dependence on renal dialysis: Secondary | ICD-10-CM | POA: Diagnosis not present

## 2018-07-03 DIAGNOSIS — N2581 Secondary hyperparathyroidism of renal origin: Secondary | ICD-10-CM | POA: Diagnosis not present

## 2018-07-03 DIAGNOSIS — D631 Anemia in chronic kidney disease: Secondary | ICD-10-CM | POA: Diagnosis not present

## 2018-07-05 DIAGNOSIS — D631 Anemia in chronic kidney disease: Secondary | ICD-10-CM | POA: Diagnosis not present

## 2018-07-05 DIAGNOSIS — N186 End stage renal disease: Secondary | ICD-10-CM | POA: Diagnosis not present

## 2018-07-05 DIAGNOSIS — N2581 Secondary hyperparathyroidism of renal origin: Secondary | ICD-10-CM | POA: Diagnosis not present

## 2018-07-05 DIAGNOSIS — E611 Iron deficiency: Secondary | ICD-10-CM | POA: Diagnosis not present

## 2018-07-05 DIAGNOSIS — Z992 Dependence on renal dialysis: Secondary | ICD-10-CM | POA: Diagnosis not present

## 2018-07-07 DIAGNOSIS — N2581 Secondary hyperparathyroidism of renal origin: Secondary | ICD-10-CM | POA: Diagnosis not present

## 2018-07-07 DIAGNOSIS — D631 Anemia in chronic kidney disease: Secondary | ICD-10-CM | POA: Diagnosis not present

## 2018-07-07 DIAGNOSIS — N186 End stage renal disease: Secondary | ICD-10-CM | POA: Diagnosis not present

## 2018-07-07 DIAGNOSIS — E611 Iron deficiency: Secondary | ICD-10-CM | POA: Diagnosis not present

## 2018-07-07 DIAGNOSIS — Z992 Dependence on renal dialysis: Secondary | ICD-10-CM | POA: Diagnosis not present

## 2018-07-10 DIAGNOSIS — N186 End stage renal disease: Secondary | ICD-10-CM | POA: Diagnosis not present

## 2018-07-10 DIAGNOSIS — D631 Anemia in chronic kidney disease: Secondary | ICD-10-CM | POA: Diagnosis not present

## 2018-07-10 DIAGNOSIS — N2581 Secondary hyperparathyroidism of renal origin: Secondary | ICD-10-CM | POA: Diagnosis not present

## 2018-07-10 DIAGNOSIS — Z992 Dependence on renal dialysis: Secondary | ICD-10-CM | POA: Diagnosis not present

## 2018-07-10 DIAGNOSIS — E611 Iron deficiency: Secondary | ICD-10-CM | POA: Diagnosis not present

## 2018-07-12 DIAGNOSIS — E611 Iron deficiency: Secondary | ICD-10-CM | POA: Diagnosis not present

## 2018-07-12 DIAGNOSIS — D631 Anemia in chronic kidney disease: Secondary | ICD-10-CM | POA: Diagnosis not present

## 2018-07-12 DIAGNOSIS — N2581 Secondary hyperparathyroidism of renal origin: Secondary | ICD-10-CM | POA: Diagnosis not present

## 2018-07-12 DIAGNOSIS — Z992 Dependence on renal dialysis: Secondary | ICD-10-CM | POA: Diagnosis not present

## 2018-07-12 DIAGNOSIS — N186 End stage renal disease: Secondary | ICD-10-CM | POA: Diagnosis not present

## 2018-07-14 DIAGNOSIS — D631 Anemia in chronic kidney disease: Secondary | ICD-10-CM | POA: Diagnosis not present

## 2018-07-14 DIAGNOSIS — N186 End stage renal disease: Secondary | ICD-10-CM | POA: Diagnosis not present

## 2018-07-14 DIAGNOSIS — Z992 Dependence on renal dialysis: Secondary | ICD-10-CM | POA: Diagnosis not present

## 2018-07-14 DIAGNOSIS — N2581 Secondary hyperparathyroidism of renal origin: Secondary | ICD-10-CM | POA: Diagnosis not present

## 2018-07-14 DIAGNOSIS — E611 Iron deficiency: Secondary | ICD-10-CM | POA: Diagnosis not present

## 2018-07-17 DIAGNOSIS — E611 Iron deficiency: Secondary | ICD-10-CM | POA: Diagnosis not present

## 2018-07-17 DIAGNOSIS — N186 End stage renal disease: Secondary | ICD-10-CM | POA: Diagnosis not present

## 2018-07-17 DIAGNOSIS — D631 Anemia in chronic kidney disease: Secondary | ICD-10-CM | POA: Diagnosis not present

## 2018-07-17 DIAGNOSIS — N2581 Secondary hyperparathyroidism of renal origin: Secondary | ICD-10-CM | POA: Diagnosis not present

## 2018-07-17 DIAGNOSIS — Z992 Dependence on renal dialysis: Secondary | ICD-10-CM | POA: Diagnosis not present

## 2018-07-17 IMAGING — XA IR AV DIALY SHUNT INTRO NEEDLE/INTRACATH INITIAL W/PTA/IMG*L*
1 series · 14 of 21 positions shown · IV contrast (IODINE)
Comparison: 09/30/2016

CLINICAL DATA: End stage renal disease, difficult cannulation,
bruit

EXAM:
DIALYSIS SHUNTOGRAM
VENOUS ANGIOPLASTY
FLUOROSCOPY TIME:  1.4 minutes, 36  [id number] DAP
TECHNIQUE: The procedure, risks (including but not limited to bleeding,
infection, organ damage ), benefits, and alternatives were explained
to the patient. Questions regarding the procedure were encouraged
and answered. The patient understands and consents to the procedure.
An 18-gauge angiocatheter was placed antegrade into the left upper
arm AV hemodialysis fistula just central to the arterial anastomosis
for dialysis fistulography. The angiocatheter and surrounding skin
were then prepped with Betadine, draped in usual sterile fashion,
infiltrated locally with 1% lidocaine. The angiocatheter was
exchanged over a Benson wire for a 7 French vascular sheath, through
which an 8 mm x 4 cm Conquest angioplasty balloon was advanced to
the level of a recurrent outflow re- stenosis for venous angioplasty
using 60 second overlapping inflations at 30 atmospheres. After
follow-up venography, the balloon was exchanged for a 12 mm x 4 cm
atlas angioplasty balloon, advanced to the level of recurrent
axillary vein stenosis, which responded well to balloon angioplasty.
Follow-up venography performed. The catheter, sheath, and guidewire
were removed and hemostasis achieved with a 2-0 Ethilon purse-string
suture. No immediate complication.

[Series 300: dsa body · 14 of 21 slices shown]
[im 1/21]
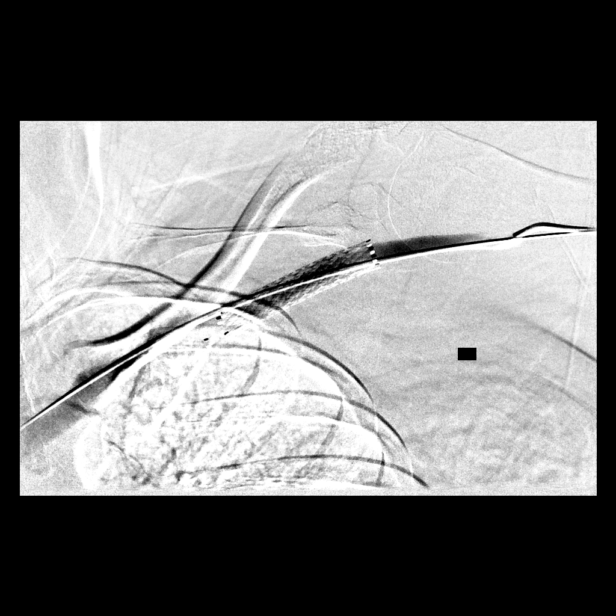
[im 3/21]
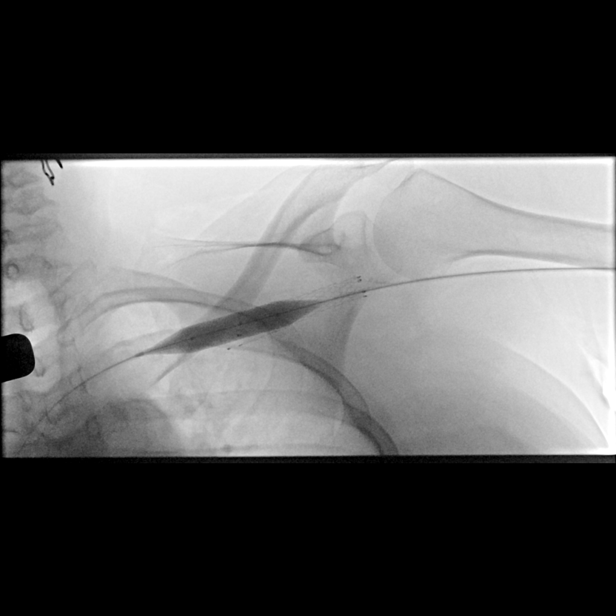
[im 4/21]
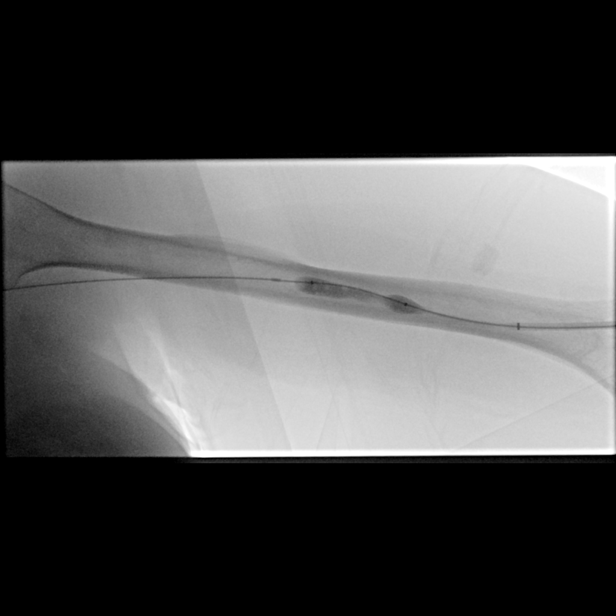
[im 6/21]
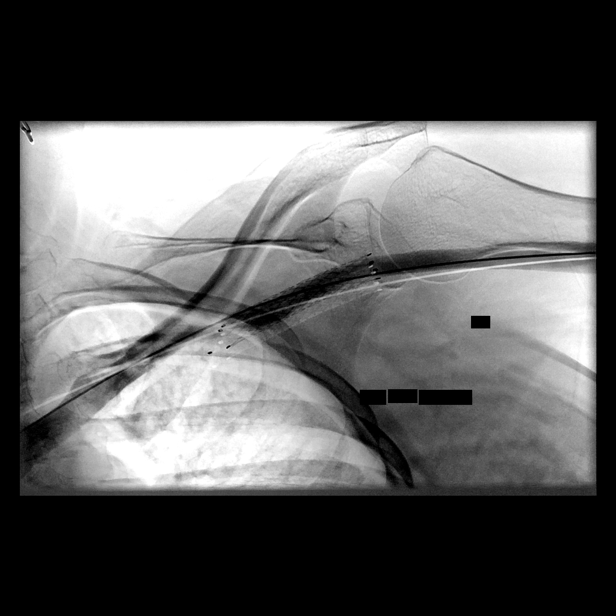
[im 7/21]
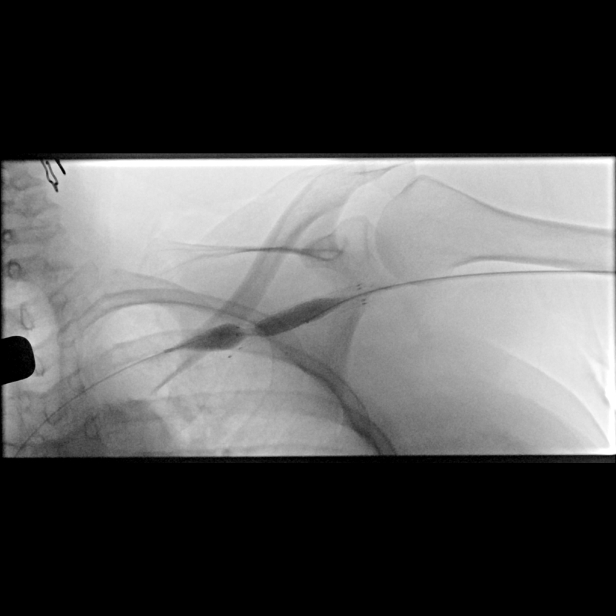
[im 9/21]
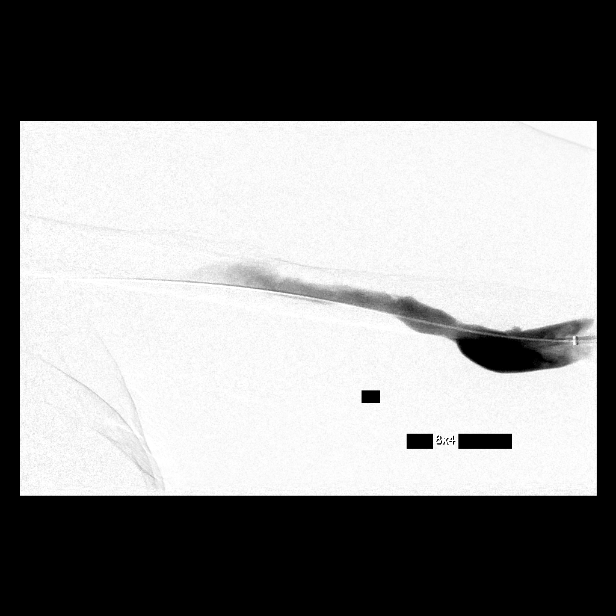
[im 10/21]
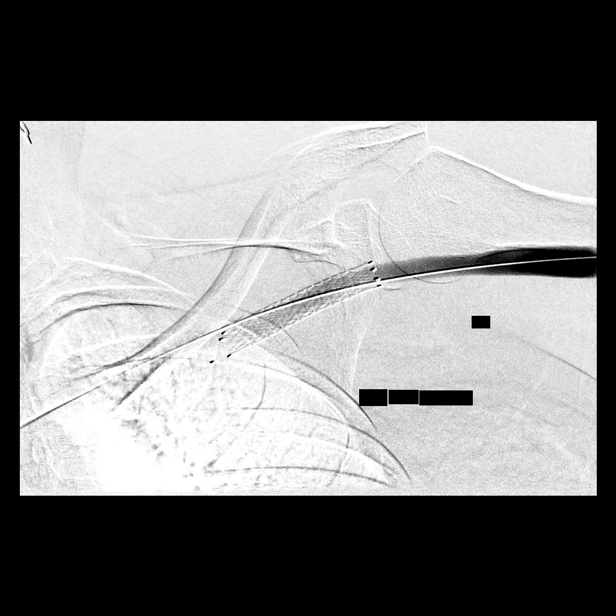
[im 12/21]
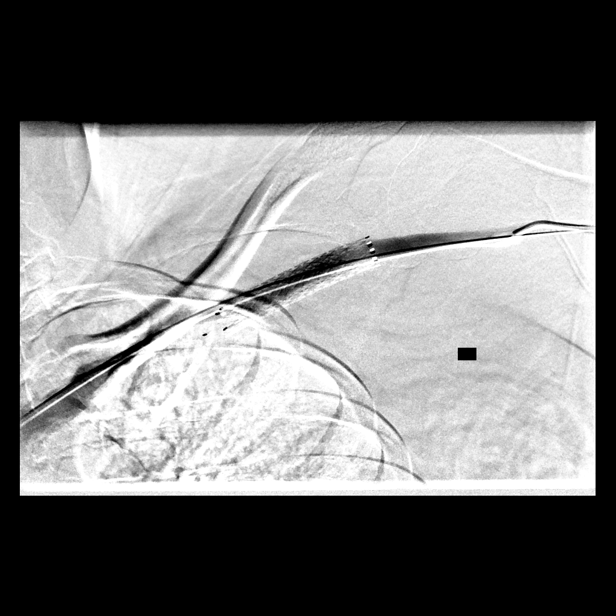
[im 13/21]
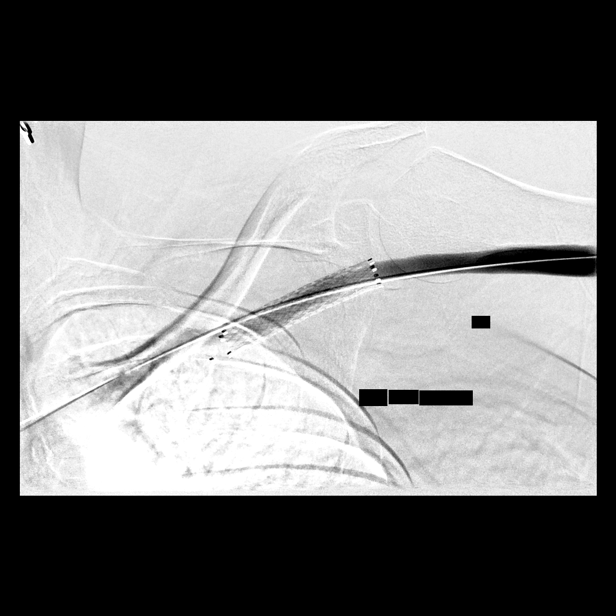
[im 15/21]
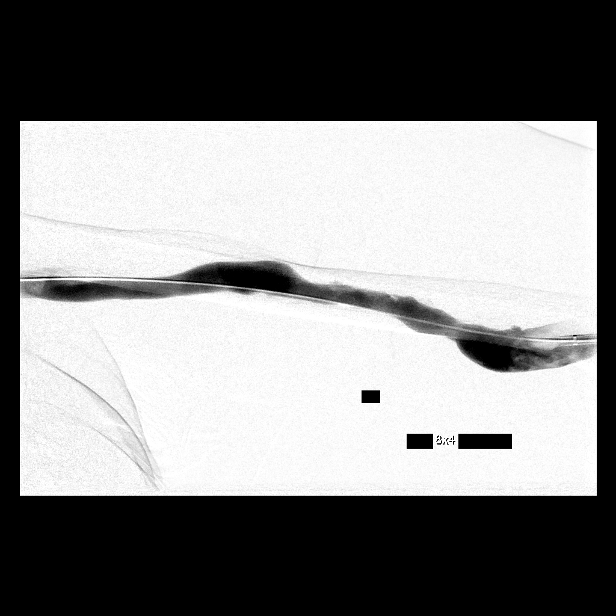
[im 16/21]
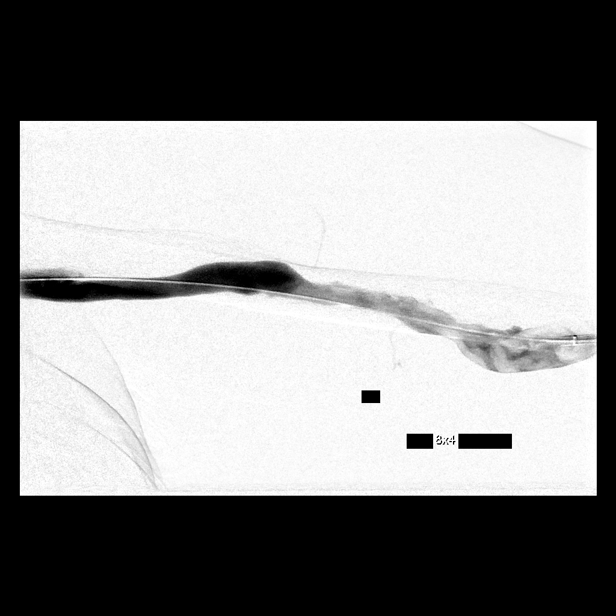
[im 18/21]
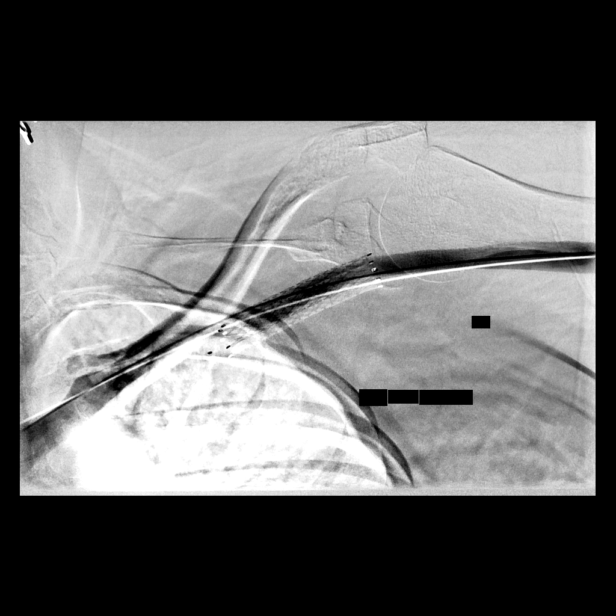
[im 19/21]
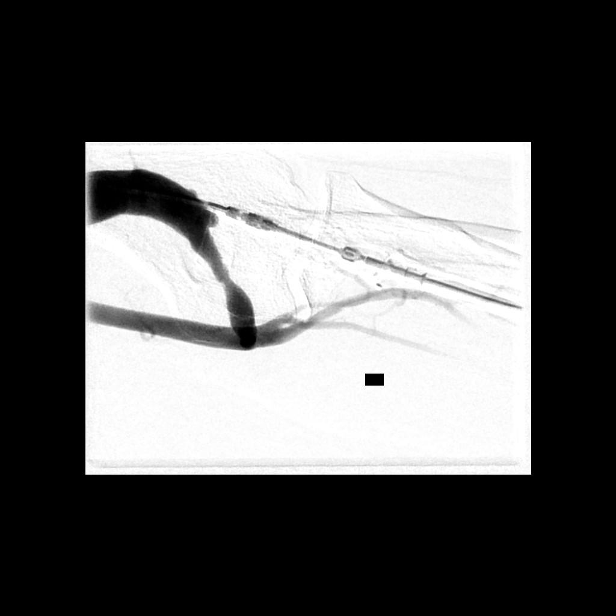
[im 21/21]
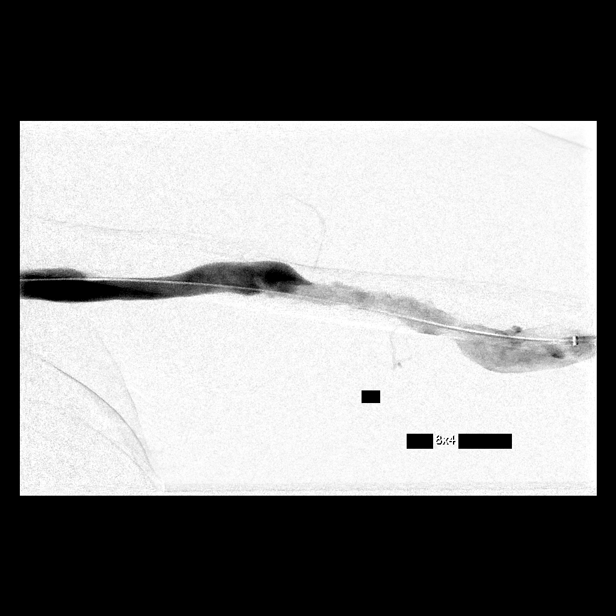

[14 of 21 positions shown; findings below may reference images not displayed]

FINDINGS: The anastomosis with the brachial artery at the elbow is patent.
There is mild tapered narrowing just central to the anastomosis.
There is a recurrent moderate stenosis in the outflow the level of
the mid humeral shaft. There is a tandem short segment stenosis at
the central aspect of the left axillary vein stent. More centrally
of the subclavian and innominate veins are widely patent.

The outflow vein re- stenosis responded well to 8 mm balloon
angioplasty.

The axillary vein re- stenosis responded well to 12 mm balloon
angioplasty.
IMPRESSION: 1. Tandem recurrent stenoses in the outflow vein and more centrally
in the axillary vein, with good response to balloon angioplasty as
above.

ACCESS:
Remains approachable for percutaneous intervention as needed.

## 2018-07-19 DIAGNOSIS — D631 Anemia in chronic kidney disease: Secondary | ICD-10-CM | POA: Diagnosis not present

## 2018-07-19 DIAGNOSIS — N2581 Secondary hyperparathyroidism of renal origin: Secondary | ICD-10-CM | POA: Diagnosis not present

## 2018-07-19 DIAGNOSIS — Z992 Dependence on renal dialysis: Secondary | ICD-10-CM | POA: Diagnosis not present

## 2018-07-19 DIAGNOSIS — E611 Iron deficiency: Secondary | ICD-10-CM | POA: Diagnosis not present

## 2018-07-19 DIAGNOSIS — N186 End stage renal disease: Secondary | ICD-10-CM | POA: Diagnosis not present

## 2018-07-21 DIAGNOSIS — E611 Iron deficiency: Secondary | ICD-10-CM | POA: Diagnosis not present

## 2018-07-21 DIAGNOSIS — N2581 Secondary hyperparathyroidism of renal origin: Secondary | ICD-10-CM | POA: Diagnosis not present

## 2018-07-21 DIAGNOSIS — N186 End stage renal disease: Secondary | ICD-10-CM | POA: Diagnosis not present

## 2018-07-21 DIAGNOSIS — D631 Anemia in chronic kidney disease: Secondary | ICD-10-CM | POA: Diagnosis not present

## 2018-07-21 DIAGNOSIS — Z992 Dependence on renal dialysis: Secondary | ICD-10-CM | POA: Diagnosis not present

## 2018-07-24 DIAGNOSIS — N186 End stage renal disease: Secondary | ICD-10-CM | POA: Diagnosis not present

## 2018-07-24 DIAGNOSIS — Z992 Dependence on renal dialysis: Secondary | ICD-10-CM | POA: Diagnosis not present

## 2018-07-24 DIAGNOSIS — E611 Iron deficiency: Secondary | ICD-10-CM | POA: Diagnosis not present

## 2018-07-24 DIAGNOSIS — N2581 Secondary hyperparathyroidism of renal origin: Secondary | ICD-10-CM | POA: Diagnosis not present

## 2018-07-24 DIAGNOSIS — D631 Anemia in chronic kidney disease: Secondary | ICD-10-CM | POA: Diagnosis not present

## 2018-07-26 DIAGNOSIS — Z992 Dependence on renal dialysis: Secondary | ICD-10-CM | POA: Diagnosis not present

## 2018-07-26 DIAGNOSIS — E611 Iron deficiency: Secondary | ICD-10-CM | POA: Diagnosis not present

## 2018-07-26 DIAGNOSIS — D631 Anemia in chronic kidney disease: Secondary | ICD-10-CM | POA: Diagnosis not present

## 2018-07-26 DIAGNOSIS — N186 End stage renal disease: Secondary | ICD-10-CM | POA: Diagnosis not present

## 2018-07-26 DIAGNOSIS — N2581 Secondary hyperparathyroidism of renal origin: Secondary | ICD-10-CM | POA: Diagnosis not present

## 2018-07-28 DIAGNOSIS — D631 Anemia in chronic kidney disease: Secondary | ICD-10-CM | POA: Diagnosis not present

## 2018-07-28 DIAGNOSIS — N186 End stage renal disease: Secondary | ICD-10-CM | POA: Diagnosis not present

## 2018-07-28 DIAGNOSIS — N2581 Secondary hyperparathyroidism of renal origin: Secondary | ICD-10-CM | POA: Diagnosis not present

## 2018-07-28 DIAGNOSIS — Z992 Dependence on renal dialysis: Secondary | ICD-10-CM | POA: Diagnosis not present

## 2018-07-28 DIAGNOSIS — E611 Iron deficiency: Secondary | ICD-10-CM | POA: Diagnosis not present

## 2018-07-31 DIAGNOSIS — N186 End stage renal disease: Secondary | ICD-10-CM | POA: Diagnosis not present

## 2018-07-31 DIAGNOSIS — Z992 Dependence on renal dialysis: Secondary | ICD-10-CM | POA: Diagnosis not present

## 2018-07-31 DIAGNOSIS — D631 Anemia in chronic kidney disease: Secondary | ICD-10-CM | POA: Diagnosis not present

## 2018-07-31 DIAGNOSIS — E611 Iron deficiency: Secondary | ICD-10-CM | POA: Diagnosis not present

## 2018-07-31 DIAGNOSIS — N2581 Secondary hyperparathyroidism of renal origin: Secondary | ICD-10-CM | POA: Diagnosis not present

## 2018-08-02 DIAGNOSIS — Z992 Dependence on renal dialysis: Secondary | ICD-10-CM | POA: Diagnosis not present

## 2018-08-02 DIAGNOSIS — N186 End stage renal disease: Secondary | ICD-10-CM | POA: Diagnosis not present

## 2018-08-02 DIAGNOSIS — N2581 Secondary hyperparathyroidism of renal origin: Secondary | ICD-10-CM | POA: Diagnosis not present

## 2018-08-02 DIAGNOSIS — E611 Iron deficiency: Secondary | ICD-10-CM | POA: Diagnosis not present

## 2018-08-02 DIAGNOSIS — D631 Anemia in chronic kidney disease: Secondary | ICD-10-CM | POA: Diagnosis not present

## 2018-08-04 DIAGNOSIS — N2581 Secondary hyperparathyroidism of renal origin: Secondary | ICD-10-CM | POA: Diagnosis not present

## 2018-08-04 DIAGNOSIS — D631 Anemia in chronic kidney disease: Secondary | ICD-10-CM | POA: Diagnosis not present

## 2018-08-04 DIAGNOSIS — E611 Iron deficiency: Secondary | ICD-10-CM | POA: Diagnosis not present

## 2018-08-04 DIAGNOSIS — Z992 Dependence on renal dialysis: Secondary | ICD-10-CM | POA: Diagnosis not present

## 2018-08-04 DIAGNOSIS — N186 End stage renal disease: Secondary | ICD-10-CM | POA: Diagnosis not present

## 2018-08-07 DIAGNOSIS — N2581 Secondary hyperparathyroidism of renal origin: Secondary | ICD-10-CM | POA: Diagnosis not present

## 2018-08-07 DIAGNOSIS — E611 Iron deficiency: Secondary | ICD-10-CM | POA: Diagnosis not present

## 2018-08-07 DIAGNOSIS — Z992 Dependence on renal dialysis: Secondary | ICD-10-CM | POA: Diagnosis not present

## 2018-08-07 DIAGNOSIS — N186 End stage renal disease: Secondary | ICD-10-CM | POA: Diagnosis not present

## 2018-08-07 DIAGNOSIS — D631 Anemia in chronic kidney disease: Secondary | ICD-10-CM | POA: Diagnosis not present

## 2018-08-09 DIAGNOSIS — D631 Anemia in chronic kidney disease: Secondary | ICD-10-CM | POA: Diagnosis not present

## 2018-08-09 DIAGNOSIS — N2581 Secondary hyperparathyroidism of renal origin: Secondary | ICD-10-CM | POA: Diagnosis not present

## 2018-08-09 DIAGNOSIS — Z992 Dependence on renal dialysis: Secondary | ICD-10-CM | POA: Diagnosis not present

## 2018-08-09 DIAGNOSIS — E611 Iron deficiency: Secondary | ICD-10-CM | POA: Diagnosis not present

## 2018-08-09 DIAGNOSIS — N186 End stage renal disease: Secondary | ICD-10-CM | POA: Diagnosis not present

## 2018-08-11 DIAGNOSIS — Z992 Dependence on renal dialysis: Secondary | ICD-10-CM | POA: Diagnosis not present

## 2018-08-11 DIAGNOSIS — E611 Iron deficiency: Secondary | ICD-10-CM | POA: Diagnosis not present

## 2018-08-11 DIAGNOSIS — N186 End stage renal disease: Secondary | ICD-10-CM | POA: Diagnosis not present

## 2018-08-11 DIAGNOSIS — N2581 Secondary hyperparathyroidism of renal origin: Secondary | ICD-10-CM | POA: Diagnosis not present

## 2018-08-11 DIAGNOSIS — D631 Anemia in chronic kidney disease: Secondary | ICD-10-CM | POA: Diagnosis not present

## 2018-08-14 DIAGNOSIS — E611 Iron deficiency: Secondary | ICD-10-CM | POA: Diagnosis not present

## 2018-08-14 DIAGNOSIS — N2581 Secondary hyperparathyroidism of renal origin: Secondary | ICD-10-CM | POA: Diagnosis not present

## 2018-08-14 DIAGNOSIS — Z992 Dependence on renal dialysis: Secondary | ICD-10-CM | POA: Diagnosis not present

## 2018-08-14 DIAGNOSIS — N186 End stage renal disease: Secondary | ICD-10-CM | POA: Diagnosis not present

## 2018-08-14 DIAGNOSIS — D631 Anemia in chronic kidney disease: Secondary | ICD-10-CM | POA: Diagnosis not present

## 2018-08-16 DIAGNOSIS — Z23 Encounter for immunization: Secondary | ICD-10-CM | POA: Diagnosis not present

## 2018-08-16 DIAGNOSIS — N2581 Secondary hyperparathyroidism of renal origin: Secondary | ICD-10-CM | POA: Diagnosis not present

## 2018-08-16 DIAGNOSIS — E611 Iron deficiency: Secondary | ICD-10-CM | POA: Diagnosis not present

## 2018-08-16 DIAGNOSIS — D631 Anemia in chronic kidney disease: Secondary | ICD-10-CM | POA: Diagnosis not present

## 2018-08-16 DIAGNOSIS — Z992 Dependence on renal dialysis: Secondary | ICD-10-CM | POA: Diagnosis not present

## 2018-08-16 DIAGNOSIS — N186 End stage renal disease: Secondary | ICD-10-CM | POA: Diagnosis not present

## 2018-08-18 DIAGNOSIS — D631 Anemia in chronic kidney disease: Secondary | ICD-10-CM | POA: Diagnosis not present

## 2018-08-18 DIAGNOSIS — N186 End stage renal disease: Secondary | ICD-10-CM | POA: Diagnosis not present

## 2018-08-18 DIAGNOSIS — Z23 Encounter for immunization: Secondary | ICD-10-CM | POA: Diagnosis not present

## 2018-08-18 DIAGNOSIS — E611 Iron deficiency: Secondary | ICD-10-CM | POA: Diagnosis not present

## 2018-08-18 DIAGNOSIS — Z992 Dependence on renal dialysis: Secondary | ICD-10-CM | POA: Diagnosis not present

## 2018-08-18 DIAGNOSIS — N2581 Secondary hyperparathyroidism of renal origin: Secondary | ICD-10-CM | POA: Diagnosis not present

## 2018-08-21 DIAGNOSIS — E611 Iron deficiency: Secondary | ICD-10-CM | POA: Diagnosis not present

## 2018-08-21 DIAGNOSIS — N186 End stage renal disease: Secondary | ICD-10-CM | POA: Diagnosis not present

## 2018-08-21 DIAGNOSIS — D631 Anemia in chronic kidney disease: Secondary | ICD-10-CM | POA: Diagnosis not present

## 2018-08-21 DIAGNOSIS — N2581 Secondary hyperparathyroidism of renal origin: Secondary | ICD-10-CM | POA: Diagnosis not present

## 2018-08-21 DIAGNOSIS — Z992 Dependence on renal dialysis: Secondary | ICD-10-CM | POA: Diagnosis not present

## 2018-08-21 DIAGNOSIS — Z23 Encounter for immunization: Secondary | ICD-10-CM | POA: Diagnosis not present

## 2018-08-23 DIAGNOSIS — Z992 Dependence on renal dialysis: Secondary | ICD-10-CM | POA: Diagnosis not present

## 2018-08-23 DIAGNOSIS — D631 Anemia in chronic kidney disease: Secondary | ICD-10-CM | POA: Diagnosis not present

## 2018-08-23 DIAGNOSIS — N2581 Secondary hyperparathyroidism of renal origin: Secondary | ICD-10-CM | POA: Diagnosis not present

## 2018-08-23 DIAGNOSIS — N186 End stage renal disease: Secondary | ICD-10-CM | POA: Diagnosis not present

## 2018-08-23 DIAGNOSIS — E611 Iron deficiency: Secondary | ICD-10-CM | POA: Diagnosis not present

## 2018-08-23 DIAGNOSIS — Z23 Encounter for immunization: Secondary | ICD-10-CM | POA: Diagnosis not present

## 2018-08-25 DIAGNOSIS — Z23 Encounter for immunization: Secondary | ICD-10-CM | POA: Diagnosis not present

## 2018-08-25 DIAGNOSIS — E611 Iron deficiency: Secondary | ICD-10-CM | POA: Diagnosis not present

## 2018-08-25 DIAGNOSIS — D631 Anemia in chronic kidney disease: Secondary | ICD-10-CM | POA: Diagnosis not present

## 2018-08-25 DIAGNOSIS — N2581 Secondary hyperparathyroidism of renal origin: Secondary | ICD-10-CM | POA: Diagnosis not present

## 2018-08-25 DIAGNOSIS — N186 End stage renal disease: Secondary | ICD-10-CM | POA: Diagnosis not present

## 2018-08-25 DIAGNOSIS — Z992 Dependence on renal dialysis: Secondary | ICD-10-CM | POA: Diagnosis not present

## 2018-08-28 DIAGNOSIS — E611 Iron deficiency: Secondary | ICD-10-CM | POA: Diagnosis not present

## 2018-08-28 DIAGNOSIS — Z23 Encounter for immunization: Secondary | ICD-10-CM | POA: Diagnosis not present

## 2018-08-28 DIAGNOSIS — D631 Anemia in chronic kidney disease: Secondary | ICD-10-CM | POA: Diagnosis not present

## 2018-08-28 DIAGNOSIS — N186 End stage renal disease: Secondary | ICD-10-CM | POA: Diagnosis not present

## 2018-08-28 DIAGNOSIS — N2581 Secondary hyperparathyroidism of renal origin: Secondary | ICD-10-CM | POA: Diagnosis not present

## 2018-08-28 DIAGNOSIS — Z992 Dependence on renal dialysis: Secondary | ICD-10-CM | POA: Diagnosis not present

## 2018-08-30 DIAGNOSIS — Z992 Dependence on renal dialysis: Secondary | ICD-10-CM | POA: Diagnosis not present

## 2018-08-30 DIAGNOSIS — D631 Anemia in chronic kidney disease: Secondary | ICD-10-CM | POA: Diagnosis not present

## 2018-08-30 DIAGNOSIS — E611 Iron deficiency: Secondary | ICD-10-CM | POA: Diagnosis not present

## 2018-08-30 DIAGNOSIS — N2581 Secondary hyperparathyroidism of renal origin: Secondary | ICD-10-CM | POA: Diagnosis not present

## 2018-08-30 DIAGNOSIS — N186 End stage renal disease: Secondary | ICD-10-CM | POA: Diagnosis not present

## 2018-08-30 DIAGNOSIS — Z23 Encounter for immunization: Secondary | ICD-10-CM | POA: Diagnosis not present

## 2018-09-01 DIAGNOSIS — D631 Anemia in chronic kidney disease: Secondary | ICD-10-CM | POA: Diagnosis not present

## 2018-09-01 DIAGNOSIS — N186 End stage renal disease: Secondary | ICD-10-CM | POA: Diagnosis not present

## 2018-09-01 DIAGNOSIS — Z23 Encounter for immunization: Secondary | ICD-10-CM | POA: Diagnosis not present

## 2018-09-01 DIAGNOSIS — N2581 Secondary hyperparathyroidism of renal origin: Secondary | ICD-10-CM | POA: Diagnosis not present

## 2018-09-01 DIAGNOSIS — E611 Iron deficiency: Secondary | ICD-10-CM | POA: Diagnosis not present

## 2018-09-01 DIAGNOSIS — Z992 Dependence on renal dialysis: Secondary | ICD-10-CM | POA: Diagnosis not present

## 2018-09-04 DIAGNOSIS — Z23 Encounter for immunization: Secondary | ICD-10-CM | POA: Diagnosis not present

## 2018-09-04 DIAGNOSIS — Z992 Dependence on renal dialysis: Secondary | ICD-10-CM | POA: Diagnosis not present

## 2018-09-04 DIAGNOSIS — N186 End stage renal disease: Secondary | ICD-10-CM | POA: Diagnosis not present

## 2018-09-04 DIAGNOSIS — N2581 Secondary hyperparathyroidism of renal origin: Secondary | ICD-10-CM | POA: Diagnosis not present

## 2018-09-04 DIAGNOSIS — D631 Anemia in chronic kidney disease: Secondary | ICD-10-CM | POA: Diagnosis not present

## 2018-09-04 DIAGNOSIS — E611 Iron deficiency: Secondary | ICD-10-CM | POA: Diagnosis not present

## 2018-09-06 DIAGNOSIS — E611 Iron deficiency: Secondary | ICD-10-CM | POA: Diagnosis not present

## 2018-09-06 DIAGNOSIS — N186 End stage renal disease: Secondary | ICD-10-CM | POA: Diagnosis not present

## 2018-09-06 DIAGNOSIS — Z992 Dependence on renal dialysis: Secondary | ICD-10-CM | POA: Diagnosis not present

## 2018-09-06 DIAGNOSIS — Z23 Encounter for immunization: Secondary | ICD-10-CM | POA: Diagnosis not present

## 2018-09-06 DIAGNOSIS — D631 Anemia in chronic kidney disease: Secondary | ICD-10-CM | POA: Diagnosis not present

## 2018-09-06 DIAGNOSIS — N2581 Secondary hyperparathyroidism of renal origin: Secondary | ICD-10-CM | POA: Diagnosis not present

## 2018-09-08 DIAGNOSIS — N2581 Secondary hyperparathyroidism of renal origin: Secondary | ICD-10-CM | POA: Diagnosis not present

## 2018-09-08 DIAGNOSIS — Z23 Encounter for immunization: Secondary | ICD-10-CM | POA: Diagnosis not present

## 2018-09-08 DIAGNOSIS — Z992 Dependence on renal dialysis: Secondary | ICD-10-CM | POA: Diagnosis not present

## 2018-09-08 DIAGNOSIS — E611 Iron deficiency: Secondary | ICD-10-CM | POA: Diagnosis not present

## 2018-09-08 DIAGNOSIS — N186 End stage renal disease: Secondary | ICD-10-CM | POA: Diagnosis not present

## 2018-09-08 DIAGNOSIS — D631 Anemia in chronic kidney disease: Secondary | ICD-10-CM | POA: Diagnosis not present

## 2018-09-11 DIAGNOSIS — Z992 Dependence on renal dialysis: Secondary | ICD-10-CM | POA: Diagnosis not present

## 2018-09-11 DIAGNOSIS — N2581 Secondary hyperparathyroidism of renal origin: Secondary | ICD-10-CM | POA: Diagnosis not present

## 2018-09-11 DIAGNOSIS — N186 End stage renal disease: Secondary | ICD-10-CM | POA: Diagnosis not present

## 2018-09-11 DIAGNOSIS — Z23 Encounter for immunization: Secondary | ICD-10-CM | POA: Diagnosis not present

## 2018-09-11 DIAGNOSIS — D631 Anemia in chronic kidney disease: Secondary | ICD-10-CM | POA: Diagnosis not present

## 2018-09-11 DIAGNOSIS — E611 Iron deficiency: Secondary | ICD-10-CM | POA: Diagnosis not present

## 2018-09-13 DIAGNOSIS — E611 Iron deficiency: Secondary | ICD-10-CM | POA: Diagnosis not present

## 2018-09-13 DIAGNOSIS — Z992 Dependence on renal dialysis: Secondary | ICD-10-CM | POA: Diagnosis not present

## 2018-09-13 DIAGNOSIS — N186 End stage renal disease: Secondary | ICD-10-CM | POA: Diagnosis not present

## 2018-09-13 DIAGNOSIS — D631 Anemia in chronic kidney disease: Secondary | ICD-10-CM | POA: Diagnosis not present

## 2018-09-13 DIAGNOSIS — Z23 Encounter for immunization: Secondary | ICD-10-CM | POA: Diagnosis not present

## 2018-09-13 DIAGNOSIS — N2581 Secondary hyperparathyroidism of renal origin: Secondary | ICD-10-CM | POA: Diagnosis not present

## 2018-09-14 DIAGNOSIS — Z992 Dependence on renal dialysis: Secondary | ICD-10-CM | POA: Diagnosis not present

## 2018-09-14 DIAGNOSIS — N186 End stage renal disease: Secondary | ICD-10-CM | POA: Diagnosis not present

## 2018-09-15 DIAGNOSIS — N186 End stage renal disease: Secondary | ICD-10-CM | POA: Diagnosis not present

## 2018-09-15 DIAGNOSIS — N2581 Secondary hyperparathyroidism of renal origin: Secondary | ICD-10-CM | POA: Diagnosis not present

## 2018-09-15 DIAGNOSIS — Z992 Dependence on renal dialysis: Secondary | ICD-10-CM | POA: Diagnosis not present

## 2018-09-15 DIAGNOSIS — D631 Anemia in chronic kidney disease: Secondary | ICD-10-CM | POA: Diagnosis not present

## 2018-09-15 DIAGNOSIS — E611 Iron deficiency: Secondary | ICD-10-CM | POA: Diagnosis not present

## 2018-09-18 DIAGNOSIS — E611 Iron deficiency: Secondary | ICD-10-CM | POA: Diagnosis not present

## 2018-09-18 DIAGNOSIS — D631 Anemia in chronic kidney disease: Secondary | ICD-10-CM | POA: Diagnosis not present

## 2018-09-18 DIAGNOSIS — N2581 Secondary hyperparathyroidism of renal origin: Secondary | ICD-10-CM | POA: Diagnosis not present

## 2018-09-18 DIAGNOSIS — Z992 Dependence on renal dialysis: Secondary | ICD-10-CM | POA: Diagnosis not present

## 2018-09-18 DIAGNOSIS — N186 End stage renal disease: Secondary | ICD-10-CM | POA: Diagnosis not present

## 2018-09-20 DIAGNOSIS — Z992 Dependence on renal dialysis: Secondary | ICD-10-CM | POA: Diagnosis not present

## 2018-09-20 DIAGNOSIS — N186 End stage renal disease: Secondary | ICD-10-CM | POA: Diagnosis not present

## 2018-09-20 DIAGNOSIS — N2581 Secondary hyperparathyroidism of renal origin: Secondary | ICD-10-CM | POA: Diagnosis not present

## 2018-09-20 DIAGNOSIS — E611 Iron deficiency: Secondary | ICD-10-CM | POA: Diagnosis not present

## 2018-09-20 DIAGNOSIS — D631 Anemia in chronic kidney disease: Secondary | ICD-10-CM | POA: Diagnosis not present

## 2018-09-22 DIAGNOSIS — N186 End stage renal disease: Secondary | ICD-10-CM | POA: Diagnosis not present

## 2018-09-22 DIAGNOSIS — Z992 Dependence on renal dialysis: Secondary | ICD-10-CM | POA: Diagnosis not present

## 2018-09-22 DIAGNOSIS — D631 Anemia in chronic kidney disease: Secondary | ICD-10-CM | POA: Diagnosis not present

## 2018-09-22 DIAGNOSIS — N2581 Secondary hyperparathyroidism of renal origin: Secondary | ICD-10-CM | POA: Diagnosis not present

## 2018-09-22 DIAGNOSIS — E611 Iron deficiency: Secondary | ICD-10-CM | POA: Diagnosis not present

## 2018-09-26 DIAGNOSIS — Z992 Dependence on renal dialysis: Secondary | ICD-10-CM | POA: Diagnosis not present

## 2018-09-26 DIAGNOSIS — Q612 Polycystic kidney, adult type: Secondary | ICD-10-CM | POA: Diagnosis not present

## 2018-09-26 DIAGNOSIS — N186 End stage renal disease: Secondary | ICD-10-CM | POA: Diagnosis not present

## 2018-09-29 DIAGNOSIS — Z992 Dependence on renal dialysis: Secondary | ICD-10-CM | POA: Diagnosis not present

## 2018-09-29 DIAGNOSIS — N186 End stage renal disease: Secondary | ICD-10-CM | POA: Diagnosis not present

## 2018-10-02 DIAGNOSIS — N186 End stage renal disease: Secondary | ICD-10-CM | POA: Diagnosis not present

## 2018-10-02 DIAGNOSIS — E611 Iron deficiency: Secondary | ICD-10-CM | POA: Diagnosis not present

## 2018-10-02 DIAGNOSIS — Z992 Dependence on renal dialysis: Secondary | ICD-10-CM | POA: Diagnosis not present

## 2018-10-02 DIAGNOSIS — N2581 Secondary hyperparathyroidism of renal origin: Secondary | ICD-10-CM | POA: Diagnosis not present

## 2018-10-02 DIAGNOSIS — D631 Anemia in chronic kidney disease: Secondary | ICD-10-CM | POA: Diagnosis not present

## 2018-10-04 DIAGNOSIS — E611 Iron deficiency: Secondary | ICD-10-CM | POA: Diagnosis not present

## 2018-10-04 DIAGNOSIS — N2581 Secondary hyperparathyroidism of renal origin: Secondary | ICD-10-CM | POA: Diagnosis not present

## 2018-10-04 DIAGNOSIS — D631 Anemia in chronic kidney disease: Secondary | ICD-10-CM | POA: Diagnosis not present

## 2018-10-04 DIAGNOSIS — N186 End stage renal disease: Secondary | ICD-10-CM | POA: Diagnosis not present

## 2018-10-04 DIAGNOSIS — Z992 Dependence on renal dialysis: Secondary | ICD-10-CM | POA: Diagnosis not present

## 2018-10-06 DIAGNOSIS — N2581 Secondary hyperparathyroidism of renal origin: Secondary | ICD-10-CM | POA: Diagnosis not present

## 2018-10-06 DIAGNOSIS — N186 End stage renal disease: Secondary | ICD-10-CM | POA: Diagnosis not present

## 2018-10-06 DIAGNOSIS — Z992 Dependence on renal dialysis: Secondary | ICD-10-CM | POA: Diagnosis not present

## 2018-10-06 DIAGNOSIS — D631 Anemia in chronic kidney disease: Secondary | ICD-10-CM | POA: Diagnosis not present

## 2018-10-06 DIAGNOSIS — E611 Iron deficiency: Secondary | ICD-10-CM | POA: Diagnosis not present

## 2018-10-09 DIAGNOSIS — N186 End stage renal disease: Secondary | ICD-10-CM | POA: Diagnosis not present

## 2018-10-09 DIAGNOSIS — Z992 Dependence on renal dialysis: Secondary | ICD-10-CM | POA: Diagnosis not present

## 2018-10-09 DIAGNOSIS — D631 Anemia in chronic kidney disease: Secondary | ICD-10-CM | POA: Diagnosis not present

## 2018-10-09 DIAGNOSIS — E611 Iron deficiency: Secondary | ICD-10-CM | POA: Diagnosis not present

## 2018-10-09 DIAGNOSIS — N2581 Secondary hyperparathyroidism of renal origin: Secondary | ICD-10-CM | POA: Diagnosis not present

## 2018-10-11 DIAGNOSIS — Z992 Dependence on renal dialysis: Secondary | ICD-10-CM | POA: Diagnosis not present

## 2018-10-11 DIAGNOSIS — N186 End stage renal disease: Secondary | ICD-10-CM | POA: Diagnosis not present

## 2018-10-11 DIAGNOSIS — E611 Iron deficiency: Secondary | ICD-10-CM | POA: Diagnosis not present

## 2018-10-11 DIAGNOSIS — D631 Anemia in chronic kidney disease: Secondary | ICD-10-CM | POA: Diagnosis not present

## 2018-10-11 DIAGNOSIS — N2581 Secondary hyperparathyroidism of renal origin: Secondary | ICD-10-CM | POA: Diagnosis not present

## 2018-10-13 DIAGNOSIS — N186 End stage renal disease: Secondary | ICD-10-CM | POA: Diagnosis not present

## 2018-10-13 DIAGNOSIS — D631 Anemia in chronic kidney disease: Secondary | ICD-10-CM | POA: Diagnosis not present

## 2018-10-13 DIAGNOSIS — E611 Iron deficiency: Secondary | ICD-10-CM | POA: Diagnosis not present

## 2018-10-13 DIAGNOSIS — N2581 Secondary hyperparathyroidism of renal origin: Secondary | ICD-10-CM | POA: Diagnosis not present

## 2018-10-13 DIAGNOSIS — Z992 Dependence on renal dialysis: Secondary | ICD-10-CM | POA: Diagnosis not present

## 2018-10-14 DIAGNOSIS — N186 End stage renal disease: Secondary | ICD-10-CM | POA: Diagnosis not present

## 2018-10-14 DIAGNOSIS — Z992 Dependence on renal dialysis: Secondary | ICD-10-CM | POA: Diagnosis not present

## 2018-10-16 DIAGNOSIS — D509 Iron deficiency anemia, unspecified: Secondary | ICD-10-CM | POA: Diagnosis not present

## 2018-10-16 DIAGNOSIS — Z992 Dependence on renal dialysis: Secondary | ICD-10-CM | POA: Diagnosis not present

## 2018-10-16 DIAGNOSIS — D631 Anemia in chronic kidney disease: Secondary | ICD-10-CM | POA: Diagnosis not present

## 2018-10-16 DIAGNOSIS — E611 Iron deficiency: Secondary | ICD-10-CM | POA: Diagnosis not present

## 2018-10-16 DIAGNOSIS — N186 End stage renal disease: Secondary | ICD-10-CM | POA: Diagnosis not present

## 2018-10-16 DIAGNOSIS — N2581 Secondary hyperparathyroidism of renal origin: Secondary | ICD-10-CM | POA: Diagnosis not present

## 2018-10-18 DIAGNOSIS — Z992 Dependence on renal dialysis: Secondary | ICD-10-CM | POA: Diagnosis not present

## 2018-10-18 DIAGNOSIS — N186 End stage renal disease: Secondary | ICD-10-CM | POA: Diagnosis not present

## 2018-10-18 DIAGNOSIS — D509 Iron deficiency anemia, unspecified: Secondary | ICD-10-CM | POA: Diagnosis not present

## 2018-10-18 DIAGNOSIS — E611 Iron deficiency: Secondary | ICD-10-CM | POA: Diagnosis not present

## 2018-10-18 DIAGNOSIS — N2581 Secondary hyperparathyroidism of renal origin: Secondary | ICD-10-CM | POA: Diagnosis not present

## 2018-10-18 DIAGNOSIS — D631 Anemia in chronic kidney disease: Secondary | ICD-10-CM | POA: Diagnosis not present

## 2018-10-20 DIAGNOSIS — Z992 Dependence on renal dialysis: Secondary | ICD-10-CM | POA: Diagnosis not present

## 2018-10-20 DIAGNOSIS — N186 End stage renal disease: Secondary | ICD-10-CM | POA: Diagnosis not present

## 2018-10-20 DIAGNOSIS — D509 Iron deficiency anemia, unspecified: Secondary | ICD-10-CM | POA: Diagnosis not present

## 2018-10-20 DIAGNOSIS — N2581 Secondary hyperparathyroidism of renal origin: Secondary | ICD-10-CM | POA: Diagnosis not present

## 2018-10-20 DIAGNOSIS — E611 Iron deficiency: Secondary | ICD-10-CM | POA: Diagnosis not present

## 2018-10-20 DIAGNOSIS — D631 Anemia in chronic kidney disease: Secondary | ICD-10-CM | POA: Diagnosis not present

## 2018-10-23 DIAGNOSIS — E611 Iron deficiency: Secondary | ICD-10-CM | POA: Diagnosis not present

## 2018-10-23 DIAGNOSIS — N2581 Secondary hyperparathyroidism of renal origin: Secondary | ICD-10-CM | POA: Diagnosis not present

## 2018-10-23 DIAGNOSIS — D631 Anemia in chronic kidney disease: Secondary | ICD-10-CM | POA: Diagnosis not present

## 2018-10-23 DIAGNOSIS — Z992 Dependence on renal dialysis: Secondary | ICD-10-CM | POA: Diagnosis not present

## 2018-10-23 DIAGNOSIS — D509 Iron deficiency anemia, unspecified: Secondary | ICD-10-CM | POA: Diagnosis not present

## 2018-10-23 DIAGNOSIS — N186 End stage renal disease: Secondary | ICD-10-CM | POA: Diagnosis not present

## 2018-10-25 DIAGNOSIS — D631 Anemia in chronic kidney disease: Secondary | ICD-10-CM | POA: Diagnosis not present

## 2018-10-25 DIAGNOSIS — N2581 Secondary hyperparathyroidism of renal origin: Secondary | ICD-10-CM | POA: Diagnosis not present

## 2018-10-25 DIAGNOSIS — Z992 Dependence on renal dialysis: Secondary | ICD-10-CM | POA: Diagnosis not present

## 2018-10-25 DIAGNOSIS — N186 End stage renal disease: Secondary | ICD-10-CM | POA: Diagnosis not present

## 2018-10-25 DIAGNOSIS — E611 Iron deficiency: Secondary | ICD-10-CM | POA: Diagnosis not present

## 2018-10-25 DIAGNOSIS — D509 Iron deficiency anemia, unspecified: Secondary | ICD-10-CM | POA: Diagnosis not present

## 2018-10-27 DIAGNOSIS — I120 Hypertensive chronic kidney disease with stage 5 chronic kidney disease or end stage renal disease: Secondary | ICD-10-CM | POA: Diagnosis not present

## 2018-10-27 DIAGNOSIS — K219 Gastro-esophageal reflux disease without esophagitis: Secondary | ICD-10-CM | POA: Diagnosis not present

## 2018-10-27 DIAGNOSIS — D509 Iron deficiency anemia, unspecified: Secondary | ICD-10-CM | POA: Diagnosis not present

## 2018-10-27 DIAGNOSIS — N186 End stage renal disease: Secondary | ICD-10-CM | POA: Diagnosis not present

## 2018-10-27 DIAGNOSIS — Z992 Dependence on renal dialysis: Secondary | ICD-10-CM | POA: Diagnosis not present

## 2018-10-27 DIAGNOSIS — S20221A Contusion of right back wall of thorax, initial encounter: Secondary | ICD-10-CM | POA: Diagnosis not present

## 2018-10-27 DIAGNOSIS — E611 Iron deficiency: Secondary | ICD-10-CM | POA: Diagnosis not present

## 2018-10-27 DIAGNOSIS — S299XXA Unspecified injury of thorax, initial encounter: Secondary | ICD-10-CM | POA: Diagnosis not present

## 2018-10-27 DIAGNOSIS — I10 Essential (primary) hypertension: Secondary | ICD-10-CM | POA: Diagnosis not present

## 2018-10-27 DIAGNOSIS — M549 Dorsalgia, unspecified: Secondary | ICD-10-CM | POA: Diagnosis not present

## 2018-10-27 DIAGNOSIS — M545 Low back pain: Secondary | ICD-10-CM | POA: Diagnosis not present

## 2018-10-27 DIAGNOSIS — S3992XA Unspecified injury of lower back, initial encounter: Secondary | ICD-10-CM | POA: Diagnosis not present

## 2018-10-27 DIAGNOSIS — W108XXA Fall (on) (from) other stairs and steps, initial encounter: Secondary | ICD-10-CM | POA: Diagnosis not present

## 2018-10-27 DIAGNOSIS — Z79899 Other long term (current) drug therapy: Secondary | ICD-10-CM | POA: Diagnosis not present

## 2018-10-27 DIAGNOSIS — N2581 Secondary hyperparathyroidism of renal origin: Secondary | ICD-10-CM | POA: Diagnosis not present

## 2018-10-27 DIAGNOSIS — D631 Anemia in chronic kidney disease: Secondary | ICD-10-CM | POA: Diagnosis not present

## 2018-10-27 DIAGNOSIS — M546 Pain in thoracic spine: Secondary | ICD-10-CM | POA: Diagnosis not present

## 2018-10-29 DIAGNOSIS — I120 Hypertensive chronic kidney disease with stage 5 chronic kidney disease or end stage renal disease: Secondary | ICD-10-CM | POA: Diagnosis not present

## 2018-10-29 DIAGNOSIS — N2889 Other specified disorders of kidney and ureter: Secondary | ICD-10-CM | POA: Diagnosis not present

## 2018-10-29 DIAGNOSIS — R112 Nausea with vomiting, unspecified: Secondary | ICD-10-CM | POA: Diagnosis not present

## 2018-10-29 DIAGNOSIS — N281 Cyst of kidney, acquired: Secondary | ICD-10-CM | POA: Diagnosis not present

## 2018-10-29 DIAGNOSIS — Z79899 Other long term (current) drug therapy: Secondary | ICD-10-CM | POA: Diagnosis not present

## 2018-10-29 DIAGNOSIS — R1110 Vomiting, unspecified: Secondary | ICD-10-CM | POA: Diagnosis not present

## 2018-10-29 DIAGNOSIS — N189 Chronic kidney disease, unspecified: Secondary | ICD-10-CM | POA: Diagnosis not present

## 2018-10-29 DIAGNOSIS — I129 Hypertensive chronic kidney disease with stage 1 through stage 4 chronic kidney disease, or unspecified chronic kidney disease: Secondary | ICD-10-CM | POA: Diagnosis not present

## 2018-10-29 DIAGNOSIS — Z992 Dependence on renal dialysis: Secondary | ICD-10-CM | POA: Diagnosis not present

## 2018-10-29 DIAGNOSIS — N200 Calculus of kidney: Secondary | ICD-10-CM | POA: Diagnosis not present

## 2018-10-29 DIAGNOSIS — N186 End stage renal disease: Secondary | ICD-10-CM | POA: Diagnosis not present

## 2018-10-29 DIAGNOSIS — I10 Essential (primary) hypertension: Secondary | ICD-10-CM | POA: Diagnosis not present

## 2018-10-30 DIAGNOSIS — N186 End stage renal disease: Secondary | ICD-10-CM | POA: Diagnosis not present

## 2018-10-30 DIAGNOSIS — N2581 Secondary hyperparathyroidism of renal origin: Secondary | ICD-10-CM | POA: Diagnosis not present

## 2018-10-30 DIAGNOSIS — D509 Iron deficiency anemia, unspecified: Secondary | ICD-10-CM | POA: Diagnosis not present

## 2018-10-30 DIAGNOSIS — D631 Anemia in chronic kidney disease: Secondary | ICD-10-CM | POA: Diagnosis not present

## 2018-10-30 DIAGNOSIS — Z992 Dependence on renal dialysis: Secondary | ICD-10-CM | POA: Diagnosis not present

## 2018-10-30 DIAGNOSIS — E611 Iron deficiency: Secondary | ICD-10-CM | POA: Diagnosis not present

## 2018-10-31 DIAGNOSIS — M545 Low back pain: Secondary | ICD-10-CM | POA: Diagnosis not present

## 2018-10-31 DIAGNOSIS — N2889 Other specified disorders of kidney and ureter: Secondary | ICD-10-CM | POA: Diagnosis not present

## 2018-10-31 DIAGNOSIS — Z6835 Body mass index (BMI) 35.0-35.9, adult: Secondary | ICD-10-CM | POA: Diagnosis not present

## 2018-10-31 DIAGNOSIS — Z299 Encounter for prophylactic measures, unspecified: Secondary | ICD-10-CM | POA: Diagnosis not present

## 2018-11-01 DIAGNOSIS — N2581 Secondary hyperparathyroidism of renal origin: Secondary | ICD-10-CM | POA: Diagnosis not present

## 2018-11-01 DIAGNOSIS — E611 Iron deficiency: Secondary | ICD-10-CM | POA: Diagnosis not present

## 2018-11-01 DIAGNOSIS — D509 Iron deficiency anemia, unspecified: Secondary | ICD-10-CM | POA: Diagnosis not present

## 2018-11-01 DIAGNOSIS — D631 Anemia in chronic kidney disease: Secondary | ICD-10-CM | POA: Diagnosis not present

## 2018-11-01 DIAGNOSIS — N186 End stage renal disease: Secondary | ICD-10-CM | POA: Diagnosis not present

## 2018-11-01 DIAGNOSIS — Z992 Dependence on renal dialysis: Secondary | ICD-10-CM | POA: Diagnosis not present

## 2018-11-03 DIAGNOSIS — D509 Iron deficiency anemia, unspecified: Secondary | ICD-10-CM | POA: Diagnosis not present

## 2018-11-03 DIAGNOSIS — Z992 Dependence on renal dialysis: Secondary | ICD-10-CM | POA: Diagnosis not present

## 2018-11-03 DIAGNOSIS — E611 Iron deficiency: Secondary | ICD-10-CM | POA: Diagnosis not present

## 2018-11-03 DIAGNOSIS — N186 End stage renal disease: Secondary | ICD-10-CM | POA: Diagnosis not present

## 2018-11-03 DIAGNOSIS — D631 Anemia in chronic kidney disease: Secondary | ICD-10-CM | POA: Diagnosis not present

## 2018-11-03 DIAGNOSIS — N2581 Secondary hyperparathyroidism of renal origin: Secondary | ICD-10-CM | POA: Diagnosis not present

## 2018-11-05 DIAGNOSIS — Z992 Dependence on renal dialysis: Secondary | ICD-10-CM | POA: Diagnosis not present

## 2018-11-05 DIAGNOSIS — D631 Anemia in chronic kidney disease: Secondary | ICD-10-CM | POA: Diagnosis not present

## 2018-11-05 DIAGNOSIS — E611 Iron deficiency: Secondary | ICD-10-CM | POA: Diagnosis not present

## 2018-11-05 DIAGNOSIS — N186 End stage renal disease: Secondary | ICD-10-CM | POA: Diagnosis not present

## 2018-11-05 DIAGNOSIS — D509 Iron deficiency anemia, unspecified: Secondary | ICD-10-CM | POA: Diagnosis not present

## 2018-11-05 DIAGNOSIS — N2581 Secondary hyperparathyroidism of renal origin: Secondary | ICD-10-CM | POA: Diagnosis not present

## 2018-11-06 DIAGNOSIS — Q612 Polycystic kidney, adult type: Secondary | ICD-10-CM | POA: Diagnosis not present

## 2018-11-06 DIAGNOSIS — Q613 Polycystic kidney, unspecified: Secondary | ICD-10-CM | POA: Diagnosis not present

## 2018-11-07 DIAGNOSIS — Z992 Dependence on renal dialysis: Secondary | ICD-10-CM | POA: Diagnosis not present

## 2018-11-07 DIAGNOSIS — E611 Iron deficiency: Secondary | ICD-10-CM | POA: Diagnosis not present

## 2018-11-07 DIAGNOSIS — N2581 Secondary hyperparathyroidism of renal origin: Secondary | ICD-10-CM | POA: Diagnosis not present

## 2018-11-07 DIAGNOSIS — N186 End stage renal disease: Secondary | ICD-10-CM | POA: Diagnosis not present

## 2018-11-07 DIAGNOSIS — D631 Anemia in chronic kidney disease: Secondary | ICD-10-CM | POA: Diagnosis not present

## 2018-11-07 DIAGNOSIS — D509 Iron deficiency anemia, unspecified: Secondary | ICD-10-CM | POA: Diagnosis not present

## 2018-11-10 DIAGNOSIS — N2581 Secondary hyperparathyroidism of renal origin: Secondary | ICD-10-CM | POA: Diagnosis not present

## 2018-11-10 DIAGNOSIS — D509 Iron deficiency anemia, unspecified: Secondary | ICD-10-CM | POA: Diagnosis not present

## 2018-11-10 DIAGNOSIS — N186 End stage renal disease: Secondary | ICD-10-CM | POA: Diagnosis not present

## 2018-11-10 DIAGNOSIS — Z992 Dependence on renal dialysis: Secondary | ICD-10-CM | POA: Diagnosis not present

## 2018-11-10 DIAGNOSIS — D631 Anemia in chronic kidney disease: Secondary | ICD-10-CM | POA: Diagnosis not present

## 2018-11-10 DIAGNOSIS — E611 Iron deficiency: Secondary | ICD-10-CM | POA: Diagnosis not present

## 2018-11-13 DIAGNOSIS — D631 Anemia in chronic kidney disease: Secondary | ICD-10-CM | POA: Diagnosis not present

## 2018-11-13 DIAGNOSIS — E611 Iron deficiency: Secondary | ICD-10-CM | POA: Diagnosis not present

## 2018-11-13 DIAGNOSIS — N186 End stage renal disease: Secondary | ICD-10-CM | POA: Diagnosis not present

## 2018-11-13 DIAGNOSIS — D509 Iron deficiency anemia, unspecified: Secondary | ICD-10-CM | POA: Diagnosis not present

## 2018-11-13 DIAGNOSIS — N2581 Secondary hyperparathyroidism of renal origin: Secondary | ICD-10-CM | POA: Diagnosis not present

## 2018-11-13 DIAGNOSIS — Z992 Dependence on renal dialysis: Secondary | ICD-10-CM | POA: Diagnosis not present

## 2018-11-14 DIAGNOSIS — Z992 Dependence on renal dialysis: Secondary | ICD-10-CM | POA: Diagnosis not present

## 2018-11-14 DIAGNOSIS — N186 End stage renal disease: Secondary | ICD-10-CM | POA: Diagnosis not present

## 2018-11-15 DIAGNOSIS — N2581 Secondary hyperparathyroidism of renal origin: Secondary | ICD-10-CM | POA: Diagnosis not present

## 2018-11-15 DIAGNOSIS — D631 Anemia in chronic kidney disease: Secondary | ICD-10-CM | POA: Diagnosis not present

## 2018-11-15 DIAGNOSIS — E611 Iron deficiency: Secondary | ICD-10-CM | POA: Diagnosis not present

## 2018-11-15 DIAGNOSIS — Z992 Dependence on renal dialysis: Secondary | ICD-10-CM | POA: Diagnosis not present

## 2018-11-15 DIAGNOSIS — N186 End stage renal disease: Secondary | ICD-10-CM | POA: Diagnosis not present

## 2018-11-17 DIAGNOSIS — N2581 Secondary hyperparathyroidism of renal origin: Secondary | ICD-10-CM | POA: Diagnosis not present

## 2018-11-17 DIAGNOSIS — D631 Anemia in chronic kidney disease: Secondary | ICD-10-CM | POA: Diagnosis not present

## 2018-11-17 DIAGNOSIS — E611 Iron deficiency: Secondary | ICD-10-CM | POA: Diagnosis not present

## 2018-11-17 DIAGNOSIS — Z992 Dependence on renal dialysis: Secondary | ICD-10-CM | POA: Diagnosis not present

## 2018-11-17 DIAGNOSIS — N186 End stage renal disease: Secondary | ICD-10-CM | POA: Diagnosis not present

## 2018-11-20 DIAGNOSIS — E611 Iron deficiency: Secondary | ICD-10-CM | POA: Diagnosis not present

## 2018-11-20 DIAGNOSIS — Z992 Dependence on renal dialysis: Secondary | ICD-10-CM | POA: Diagnosis not present

## 2018-11-20 DIAGNOSIS — D631 Anemia in chronic kidney disease: Secondary | ICD-10-CM | POA: Diagnosis not present

## 2018-11-20 DIAGNOSIS — N2581 Secondary hyperparathyroidism of renal origin: Secondary | ICD-10-CM | POA: Diagnosis not present

## 2018-11-20 DIAGNOSIS — N186 End stage renal disease: Secondary | ICD-10-CM | POA: Diagnosis not present

## 2018-11-21 DIAGNOSIS — Z7189 Other specified counseling: Secondary | ICD-10-CM | POA: Diagnosis not present

## 2018-11-21 DIAGNOSIS — Z299 Encounter for prophylactic measures, unspecified: Secondary | ICD-10-CM | POA: Diagnosis not present

## 2018-11-21 DIAGNOSIS — Z1211 Encounter for screening for malignant neoplasm of colon: Secondary | ICD-10-CM | POA: Diagnosis not present

## 2018-11-21 DIAGNOSIS — Z6835 Body mass index (BMI) 35.0-35.9, adult: Secondary | ICD-10-CM | POA: Diagnosis not present

## 2018-11-21 DIAGNOSIS — Z789 Other specified health status: Secondary | ICD-10-CM | POA: Diagnosis not present

## 2018-11-21 DIAGNOSIS — Q613 Polycystic kidney, unspecified: Secondary | ICD-10-CM | POA: Diagnosis not present

## 2018-11-21 DIAGNOSIS — E7800 Pure hypercholesterolemia, unspecified: Secondary | ICD-10-CM | POA: Diagnosis not present

## 2018-11-21 DIAGNOSIS — Z1339 Encounter for screening examination for other mental health and behavioral disorders: Secondary | ICD-10-CM | POA: Diagnosis not present

## 2018-11-21 DIAGNOSIS — Z0000 Encounter for general adult medical examination without abnormal findings: Secondary | ICD-10-CM | POA: Diagnosis not present

## 2018-11-21 DIAGNOSIS — Z1331 Encounter for screening for depression: Secondary | ICD-10-CM | POA: Diagnosis not present

## 2018-11-22 DIAGNOSIS — Z992 Dependence on renal dialysis: Secondary | ICD-10-CM | POA: Diagnosis not present

## 2018-11-22 DIAGNOSIS — N186 End stage renal disease: Secondary | ICD-10-CM | POA: Diagnosis not present

## 2018-11-22 DIAGNOSIS — D631 Anemia in chronic kidney disease: Secondary | ICD-10-CM | POA: Diagnosis not present

## 2018-11-22 DIAGNOSIS — N2581 Secondary hyperparathyroidism of renal origin: Secondary | ICD-10-CM | POA: Diagnosis not present

## 2018-11-22 DIAGNOSIS — E611 Iron deficiency: Secondary | ICD-10-CM | POA: Diagnosis not present

## 2018-11-24 DIAGNOSIS — N186 End stage renal disease: Secondary | ICD-10-CM | POA: Diagnosis not present

## 2018-11-24 DIAGNOSIS — Z992 Dependence on renal dialysis: Secondary | ICD-10-CM | POA: Diagnosis not present

## 2018-11-24 DIAGNOSIS — E611 Iron deficiency: Secondary | ICD-10-CM | POA: Diagnosis not present

## 2018-11-24 DIAGNOSIS — D631 Anemia in chronic kidney disease: Secondary | ICD-10-CM | POA: Diagnosis not present

## 2018-11-24 DIAGNOSIS — N2581 Secondary hyperparathyroidism of renal origin: Secondary | ICD-10-CM | POA: Diagnosis not present

## 2018-11-27 DIAGNOSIS — N2581 Secondary hyperparathyroidism of renal origin: Secondary | ICD-10-CM | POA: Diagnosis not present

## 2018-11-27 DIAGNOSIS — E611 Iron deficiency: Secondary | ICD-10-CM | POA: Diagnosis not present

## 2018-11-27 DIAGNOSIS — D631 Anemia in chronic kidney disease: Secondary | ICD-10-CM | POA: Diagnosis not present

## 2018-11-27 DIAGNOSIS — Z992 Dependence on renal dialysis: Secondary | ICD-10-CM | POA: Diagnosis not present

## 2018-11-27 DIAGNOSIS — N186 End stage renal disease: Secondary | ICD-10-CM | POA: Diagnosis not present

## 2018-11-29 DIAGNOSIS — E611 Iron deficiency: Secondary | ICD-10-CM | POA: Diagnosis not present

## 2018-11-29 DIAGNOSIS — Z992 Dependence on renal dialysis: Secondary | ICD-10-CM | POA: Diagnosis not present

## 2018-11-29 DIAGNOSIS — N2581 Secondary hyperparathyroidism of renal origin: Secondary | ICD-10-CM | POA: Diagnosis not present

## 2018-11-29 DIAGNOSIS — N186 End stage renal disease: Secondary | ICD-10-CM | POA: Diagnosis not present

## 2018-11-29 DIAGNOSIS — D631 Anemia in chronic kidney disease: Secondary | ICD-10-CM | POA: Diagnosis not present

## 2018-12-01 DIAGNOSIS — N186 End stage renal disease: Secondary | ICD-10-CM | POA: Diagnosis not present

## 2018-12-01 DIAGNOSIS — Z992 Dependence on renal dialysis: Secondary | ICD-10-CM | POA: Diagnosis not present

## 2018-12-01 DIAGNOSIS — N2581 Secondary hyperparathyroidism of renal origin: Secondary | ICD-10-CM | POA: Diagnosis not present

## 2018-12-01 DIAGNOSIS — E611 Iron deficiency: Secondary | ICD-10-CM | POA: Diagnosis not present

## 2018-12-01 DIAGNOSIS — D631 Anemia in chronic kidney disease: Secondary | ICD-10-CM | POA: Diagnosis not present

## 2018-12-04 DIAGNOSIS — Z992 Dependence on renal dialysis: Secondary | ICD-10-CM | POA: Diagnosis not present

## 2018-12-04 DIAGNOSIS — N2581 Secondary hyperparathyroidism of renal origin: Secondary | ICD-10-CM | POA: Diagnosis not present

## 2018-12-04 DIAGNOSIS — D631 Anemia in chronic kidney disease: Secondary | ICD-10-CM | POA: Diagnosis not present

## 2018-12-04 DIAGNOSIS — N186 End stage renal disease: Secondary | ICD-10-CM | POA: Diagnosis not present

## 2018-12-04 DIAGNOSIS — E611 Iron deficiency: Secondary | ICD-10-CM | POA: Diagnosis not present

## 2018-12-06 DIAGNOSIS — Z992 Dependence on renal dialysis: Secondary | ICD-10-CM | POA: Diagnosis not present

## 2018-12-06 DIAGNOSIS — E611 Iron deficiency: Secondary | ICD-10-CM | POA: Diagnosis not present

## 2018-12-06 DIAGNOSIS — N186 End stage renal disease: Secondary | ICD-10-CM | POA: Diagnosis not present

## 2018-12-06 DIAGNOSIS — D631 Anemia in chronic kidney disease: Secondary | ICD-10-CM | POA: Diagnosis not present

## 2018-12-06 DIAGNOSIS — N2581 Secondary hyperparathyroidism of renal origin: Secondary | ICD-10-CM | POA: Diagnosis not present

## 2018-12-07 DIAGNOSIS — Z992 Dependence on renal dialysis: Secondary | ICD-10-CM | POA: Diagnosis not present

## 2018-12-07 DIAGNOSIS — R300 Dysuria: Secondary | ICD-10-CM | POA: Diagnosis not present

## 2018-12-07 DIAGNOSIS — N3001 Acute cystitis with hematuria: Secondary | ICD-10-CM | POA: Diagnosis not present

## 2018-12-07 DIAGNOSIS — N186 End stage renal disease: Secondary | ICD-10-CM | POA: Diagnosis not present

## 2018-12-08 DIAGNOSIS — N2581 Secondary hyperparathyroidism of renal origin: Secondary | ICD-10-CM | POA: Diagnosis not present

## 2018-12-08 DIAGNOSIS — D631 Anemia in chronic kidney disease: Secondary | ICD-10-CM | POA: Diagnosis not present

## 2018-12-08 DIAGNOSIS — Z992 Dependence on renal dialysis: Secondary | ICD-10-CM | POA: Diagnosis not present

## 2018-12-08 DIAGNOSIS — E611 Iron deficiency: Secondary | ICD-10-CM | POA: Diagnosis not present

## 2018-12-08 DIAGNOSIS — N186 End stage renal disease: Secondary | ICD-10-CM | POA: Diagnosis not present

## 2018-12-11 DIAGNOSIS — E611 Iron deficiency: Secondary | ICD-10-CM | POA: Diagnosis not present

## 2018-12-11 DIAGNOSIS — N186 End stage renal disease: Secondary | ICD-10-CM | POA: Diagnosis not present

## 2018-12-11 DIAGNOSIS — N2581 Secondary hyperparathyroidism of renal origin: Secondary | ICD-10-CM | POA: Diagnosis not present

## 2018-12-11 DIAGNOSIS — Z992 Dependence on renal dialysis: Secondary | ICD-10-CM | POA: Diagnosis not present

## 2018-12-11 DIAGNOSIS — D631 Anemia in chronic kidney disease: Secondary | ICD-10-CM | POA: Diagnosis not present

## 2018-12-12 DIAGNOSIS — Z1231 Encounter for screening mammogram for malignant neoplasm of breast: Secondary | ICD-10-CM | POA: Diagnosis not present

## 2018-12-13 DIAGNOSIS — Z992 Dependence on renal dialysis: Secondary | ICD-10-CM | POA: Diagnosis not present

## 2018-12-13 DIAGNOSIS — N186 End stage renal disease: Secondary | ICD-10-CM | POA: Diagnosis not present

## 2018-12-13 DIAGNOSIS — E611 Iron deficiency: Secondary | ICD-10-CM | POA: Diagnosis not present

## 2018-12-13 DIAGNOSIS — N2581 Secondary hyperparathyroidism of renal origin: Secondary | ICD-10-CM | POA: Diagnosis not present

## 2018-12-13 DIAGNOSIS — D631 Anemia in chronic kidney disease: Secondary | ICD-10-CM | POA: Diagnosis not present

## 2018-12-15 DIAGNOSIS — E611 Iron deficiency: Secondary | ICD-10-CM | POA: Diagnosis not present

## 2018-12-15 DIAGNOSIS — Z992 Dependence on renal dialysis: Secondary | ICD-10-CM | POA: Diagnosis not present

## 2018-12-15 DIAGNOSIS — N186 End stage renal disease: Secondary | ICD-10-CM | POA: Diagnosis not present

## 2018-12-15 DIAGNOSIS — N2581 Secondary hyperparathyroidism of renal origin: Secondary | ICD-10-CM | POA: Diagnosis not present

## 2018-12-15 DIAGNOSIS — D631 Anemia in chronic kidney disease: Secondary | ICD-10-CM | POA: Diagnosis not present

## 2018-12-18 DIAGNOSIS — Z992 Dependence on renal dialysis: Secondary | ICD-10-CM | POA: Diagnosis not present

## 2018-12-18 DIAGNOSIS — N2581 Secondary hyperparathyroidism of renal origin: Secondary | ICD-10-CM | POA: Diagnosis not present

## 2018-12-18 DIAGNOSIS — E611 Iron deficiency: Secondary | ICD-10-CM | POA: Diagnosis not present

## 2018-12-18 DIAGNOSIS — N186 End stage renal disease: Secondary | ICD-10-CM | POA: Diagnosis not present

## 2018-12-18 DIAGNOSIS — D631 Anemia in chronic kidney disease: Secondary | ICD-10-CM | POA: Diagnosis not present

## 2018-12-20 DIAGNOSIS — D631 Anemia in chronic kidney disease: Secondary | ICD-10-CM | POA: Diagnosis not present

## 2018-12-20 DIAGNOSIS — E611 Iron deficiency: Secondary | ICD-10-CM | POA: Diagnosis not present

## 2018-12-20 DIAGNOSIS — N2581 Secondary hyperparathyroidism of renal origin: Secondary | ICD-10-CM | POA: Diagnosis not present

## 2018-12-20 DIAGNOSIS — N186 End stage renal disease: Secondary | ICD-10-CM | POA: Diagnosis not present

## 2018-12-20 DIAGNOSIS — Z992 Dependence on renal dialysis: Secondary | ICD-10-CM | POA: Diagnosis not present

## 2018-12-22 DIAGNOSIS — N2581 Secondary hyperparathyroidism of renal origin: Secondary | ICD-10-CM | POA: Diagnosis not present

## 2018-12-22 DIAGNOSIS — Z992 Dependence on renal dialysis: Secondary | ICD-10-CM | POA: Diagnosis not present

## 2018-12-22 DIAGNOSIS — D631 Anemia in chronic kidney disease: Secondary | ICD-10-CM | POA: Diagnosis not present

## 2018-12-22 DIAGNOSIS — N186 End stage renal disease: Secondary | ICD-10-CM | POA: Diagnosis not present

## 2018-12-22 DIAGNOSIS — E611 Iron deficiency: Secondary | ICD-10-CM | POA: Diagnosis not present

## 2018-12-25 DIAGNOSIS — E611 Iron deficiency: Secondary | ICD-10-CM | POA: Diagnosis not present

## 2018-12-25 DIAGNOSIS — N2581 Secondary hyperparathyroidism of renal origin: Secondary | ICD-10-CM | POA: Diagnosis not present

## 2018-12-25 DIAGNOSIS — Z992 Dependence on renal dialysis: Secondary | ICD-10-CM | POA: Diagnosis not present

## 2018-12-25 DIAGNOSIS — N186 End stage renal disease: Secondary | ICD-10-CM | POA: Diagnosis not present

## 2018-12-25 DIAGNOSIS — D631 Anemia in chronic kidney disease: Secondary | ICD-10-CM | POA: Diagnosis not present

## 2018-12-27 DIAGNOSIS — D631 Anemia in chronic kidney disease: Secondary | ICD-10-CM | POA: Diagnosis not present

## 2018-12-27 DIAGNOSIS — Z992 Dependence on renal dialysis: Secondary | ICD-10-CM | POA: Diagnosis not present

## 2018-12-27 DIAGNOSIS — N186 End stage renal disease: Secondary | ICD-10-CM | POA: Diagnosis not present

## 2018-12-27 DIAGNOSIS — N2581 Secondary hyperparathyroidism of renal origin: Secondary | ICD-10-CM | POA: Diagnosis not present

## 2018-12-27 DIAGNOSIS — E611 Iron deficiency: Secondary | ICD-10-CM | POA: Diagnosis not present

## 2018-12-29 DIAGNOSIS — N2581 Secondary hyperparathyroidism of renal origin: Secondary | ICD-10-CM | POA: Diagnosis not present

## 2018-12-29 DIAGNOSIS — E611 Iron deficiency: Secondary | ICD-10-CM | POA: Diagnosis not present

## 2018-12-29 DIAGNOSIS — D631 Anemia in chronic kidney disease: Secondary | ICD-10-CM | POA: Diagnosis not present

## 2018-12-29 DIAGNOSIS — N186 End stage renal disease: Secondary | ICD-10-CM | POA: Diagnosis not present

## 2018-12-29 DIAGNOSIS — Z992 Dependence on renal dialysis: Secondary | ICD-10-CM | POA: Diagnosis not present

## 2019-01-01 DIAGNOSIS — N186 End stage renal disease: Secondary | ICD-10-CM | POA: Diagnosis not present

## 2019-01-01 DIAGNOSIS — Z992 Dependence on renal dialysis: Secondary | ICD-10-CM | POA: Diagnosis not present

## 2019-01-01 DIAGNOSIS — D631 Anemia in chronic kidney disease: Secondary | ICD-10-CM | POA: Diagnosis not present

## 2019-01-01 DIAGNOSIS — E611 Iron deficiency: Secondary | ICD-10-CM | POA: Diagnosis not present

## 2019-01-01 DIAGNOSIS — N2581 Secondary hyperparathyroidism of renal origin: Secondary | ICD-10-CM | POA: Diagnosis not present

## 2019-01-03 DIAGNOSIS — N2581 Secondary hyperparathyroidism of renal origin: Secondary | ICD-10-CM | POA: Diagnosis not present

## 2019-01-03 DIAGNOSIS — D631 Anemia in chronic kidney disease: Secondary | ICD-10-CM | POA: Diagnosis not present

## 2019-01-03 DIAGNOSIS — N186 End stage renal disease: Secondary | ICD-10-CM | POA: Diagnosis not present

## 2019-01-03 DIAGNOSIS — E611 Iron deficiency: Secondary | ICD-10-CM | POA: Diagnosis not present

## 2019-01-03 DIAGNOSIS — Z992 Dependence on renal dialysis: Secondary | ICD-10-CM | POA: Diagnosis not present

## 2019-01-05 DIAGNOSIS — D631 Anemia in chronic kidney disease: Secondary | ICD-10-CM | POA: Diagnosis not present

## 2019-01-05 DIAGNOSIS — N2581 Secondary hyperparathyroidism of renal origin: Secondary | ICD-10-CM | POA: Diagnosis not present

## 2019-01-05 DIAGNOSIS — N186 End stage renal disease: Secondary | ICD-10-CM | POA: Diagnosis not present

## 2019-01-05 DIAGNOSIS — E611 Iron deficiency: Secondary | ICD-10-CM | POA: Diagnosis not present

## 2019-01-05 DIAGNOSIS — Z7682 Awaiting organ transplant status: Secondary | ICD-10-CM | POA: Diagnosis not present

## 2019-01-05 DIAGNOSIS — Z992 Dependence on renal dialysis: Secondary | ICD-10-CM | POA: Diagnosis not present

## 2019-01-08 DIAGNOSIS — E611 Iron deficiency: Secondary | ICD-10-CM | POA: Diagnosis not present

## 2019-01-08 DIAGNOSIS — N2581 Secondary hyperparathyroidism of renal origin: Secondary | ICD-10-CM | POA: Diagnosis not present

## 2019-01-08 DIAGNOSIS — Z992 Dependence on renal dialysis: Secondary | ICD-10-CM | POA: Diagnosis not present

## 2019-01-08 DIAGNOSIS — N186 End stage renal disease: Secondary | ICD-10-CM | POA: Diagnosis not present

## 2019-01-08 DIAGNOSIS — D631 Anemia in chronic kidney disease: Secondary | ICD-10-CM | POA: Diagnosis not present

## 2019-01-10 DIAGNOSIS — E611 Iron deficiency: Secondary | ICD-10-CM | POA: Diagnosis not present

## 2019-01-10 DIAGNOSIS — N186 End stage renal disease: Secondary | ICD-10-CM | POA: Diagnosis not present

## 2019-01-10 DIAGNOSIS — Z992 Dependence on renal dialysis: Secondary | ICD-10-CM | POA: Diagnosis not present

## 2019-01-10 DIAGNOSIS — D631 Anemia in chronic kidney disease: Secondary | ICD-10-CM | POA: Diagnosis not present

## 2019-01-10 DIAGNOSIS — N2581 Secondary hyperparathyroidism of renal origin: Secondary | ICD-10-CM | POA: Diagnosis not present

## 2019-01-12 DIAGNOSIS — D631 Anemia in chronic kidney disease: Secondary | ICD-10-CM | POA: Diagnosis not present

## 2019-01-12 DIAGNOSIS — Z992 Dependence on renal dialysis: Secondary | ICD-10-CM | POA: Diagnosis not present

## 2019-01-12 DIAGNOSIS — N2581 Secondary hyperparathyroidism of renal origin: Secondary | ICD-10-CM | POA: Diagnosis not present

## 2019-01-12 DIAGNOSIS — E611 Iron deficiency: Secondary | ICD-10-CM | POA: Diagnosis not present

## 2019-01-12 DIAGNOSIS — N186 End stage renal disease: Secondary | ICD-10-CM | POA: Diagnosis not present

## 2019-01-13 DIAGNOSIS — N186 End stage renal disease: Secondary | ICD-10-CM | POA: Diagnosis not present

## 2019-01-13 DIAGNOSIS — Z992 Dependence on renal dialysis: Secondary | ICD-10-CM | POA: Diagnosis not present

## 2019-01-15 DIAGNOSIS — N2581 Secondary hyperparathyroidism of renal origin: Secondary | ICD-10-CM | POA: Diagnosis not present

## 2019-01-15 DIAGNOSIS — Z992 Dependence on renal dialysis: Secondary | ICD-10-CM | POA: Diagnosis not present

## 2019-01-15 DIAGNOSIS — Z01818 Encounter for other preprocedural examination: Secondary | ICD-10-CM | POA: Diagnosis not present

## 2019-01-15 DIAGNOSIS — Z7982 Long term (current) use of aspirin: Secondary | ICD-10-CM | POA: Diagnosis not present

## 2019-01-15 DIAGNOSIS — D631 Anemia in chronic kidney disease: Secondary | ICD-10-CM | POA: Diagnosis not present

## 2019-01-15 DIAGNOSIS — N186 End stage renal disease: Secondary | ICD-10-CM | POA: Diagnosis not present

## 2019-01-15 DIAGNOSIS — E611 Iron deficiency: Secondary | ICD-10-CM | POA: Diagnosis not present

## 2019-01-17 DIAGNOSIS — N2581 Secondary hyperparathyroidism of renal origin: Secondary | ICD-10-CM | POA: Diagnosis not present

## 2019-01-17 DIAGNOSIS — E611 Iron deficiency: Secondary | ICD-10-CM | POA: Diagnosis not present

## 2019-01-17 DIAGNOSIS — N186 End stage renal disease: Secondary | ICD-10-CM | POA: Diagnosis not present

## 2019-01-17 DIAGNOSIS — Z992 Dependence on renal dialysis: Secondary | ICD-10-CM | POA: Diagnosis not present

## 2019-01-17 DIAGNOSIS — D631 Anemia in chronic kidney disease: Secondary | ICD-10-CM | POA: Diagnosis not present

## 2019-01-19 DIAGNOSIS — E611 Iron deficiency: Secondary | ICD-10-CM | POA: Diagnosis not present

## 2019-01-19 DIAGNOSIS — Z992 Dependence on renal dialysis: Secondary | ICD-10-CM | POA: Diagnosis not present

## 2019-01-19 DIAGNOSIS — N2581 Secondary hyperparathyroidism of renal origin: Secondary | ICD-10-CM | POA: Diagnosis not present

## 2019-01-19 DIAGNOSIS — D631 Anemia in chronic kidney disease: Secondary | ICD-10-CM | POA: Diagnosis not present

## 2019-01-19 DIAGNOSIS — N186 End stage renal disease: Secondary | ICD-10-CM | POA: Diagnosis not present

## 2019-01-22 DIAGNOSIS — N2581 Secondary hyperparathyroidism of renal origin: Secondary | ICD-10-CM | POA: Diagnosis not present

## 2019-01-22 DIAGNOSIS — Z992 Dependence on renal dialysis: Secondary | ICD-10-CM | POA: Diagnosis not present

## 2019-01-22 DIAGNOSIS — N186 End stage renal disease: Secondary | ICD-10-CM | POA: Diagnosis not present

## 2019-01-22 DIAGNOSIS — D631 Anemia in chronic kidney disease: Secondary | ICD-10-CM | POA: Diagnosis not present

## 2019-01-22 DIAGNOSIS — E611 Iron deficiency: Secondary | ICD-10-CM | POA: Diagnosis not present

## 2019-01-24 DIAGNOSIS — E611 Iron deficiency: Secondary | ICD-10-CM | POA: Diagnosis not present

## 2019-01-24 DIAGNOSIS — D631 Anemia in chronic kidney disease: Secondary | ICD-10-CM | POA: Diagnosis not present

## 2019-01-24 DIAGNOSIS — N186 End stage renal disease: Secondary | ICD-10-CM | POA: Diagnosis not present

## 2019-01-24 DIAGNOSIS — Z992 Dependence on renal dialysis: Secondary | ICD-10-CM | POA: Diagnosis not present

## 2019-01-24 DIAGNOSIS — N2581 Secondary hyperparathyroidism of renal origin: Secondary | ICD-10-CM | POA: Diagnosis not present

## 2019-01-26 DIAGNOSIS — Z992 Dependence on renal dialysis: Secondary | ICD-10-CM | POA: Diagnosis not present

## 2019-01-26 DIAGNOSIS — E611 Iron deficiency: Secondary | ICD-10-CM | POA: Diagnosis not present

## 2019-01-26 DIAGNOSIS — N2581 Secondary hyperparathyroidism of renal origin: Secondary | ICD-10-CM | POA: Diagnosis not present

## 2019-01-26 DIAGNOSIS — N186 End stage renal disease: Secondary | ICD-10-CM | POA: Diagnosis not present

## 2019-01-26 DIAGNOSIS — D631 Anemia in chronic kidney disease: Secondary | ICD-10-CM | POA: Diagnosis not present

## 2019-01-28 DIAGNOSIS — Z01818 Encounter for other preprocedural examination: Secondary | ICD-10-CM | POA: Diagnosis not present

## 2019-01-28 DIAGNOSIS — Z992 Dependence on renal dialysis: Secondary | ICD-10-CM | POA: Diagnosis not present

## 2019-01-28 DIAGNOSIS — E875 Hyperkalemia: Secondary | ICD-10-CM | POA: Diagnosis not present

## 2019-01-28 DIAGNOSIS — R739 Hyperglycemia, unspecified: Secondary | ICD-10-CM | POA: Diagnosis not present

## 2019-01-28 DIAGNOSIS — R918 Other nonspecific abnormal finding of lung field: Secondary | ICD-10-CM | POA: Diagnosis not present

## 2019-01-28 DIAGNOSIS — Z881 Allergy status to other antibiotic agents status: Secondary | ICD-10-CM | POA: Diagnosis not present

## 2019-01-28 DIAGNOSIS — Q612 Polycystic kidney, adult type: Secondary | ICD-10-CM | POA: Diagnosis not present

## 2019-01-28 DIAGNOSIS — Z940 Kidney transplant status: Secondary | ICD-10-CM | POA: Diagnosis not present

## 2019-01-28 DIAGNOSIS — Q613 Polycystic kidney, unspecified: Secondary | ICD-10-CM | POA: Diagnosis not present

## 2019-01-28 DIAGNOSIS — D899 Disorder involving the immune mechanism, unspecified: Secondary | ICD-10-CM | POA: Diagnosis not present

## 2019-01-28 DIAGNOSIS — N186 End stage renal disease: Secondary | ICD-10-CM | POA: Diagnosis present

## 2019-01-28 DIAGNOSIS — Z452 Encounter for adjustment and management of vascular access device: Secondary | ICD-10-CM | POA: Diagnosis not present

## 2019-01-28 DIAGNOSIS — Z882 Allergy status to sulfonamides status: Secondary | ICD-10-CM | POA: Diagnosis not present

## 2019-01-28 DIAGNOSIS — I120 Hypertensive chronic kidney disease with stage 5 chronic kidney disease or end stage renal disease: Secondary | ICD-10-CM | POA: Diagnosis present

## 2019-01-28 DIAGNOSIS — T380X5A Adverse effect of glucocorticoids and synthetic analogues, initial encounter: Secondary | ICD-10-CM | POA: Diagnosis not present

## 2019-01-28 DIAGNOSIS — R34 Anuria and oliguria: Secondary | ICD-10-CM | POA: Diagnosis not present

## 2019-02-08 DIAGNOSIS — Z792 Long term (current) use of antibiotics: Secondary | ICD-10-CM | POA: Diagnosis not present

## 2019-02-08 DIAGNOSIS — Z960 Presence of urogenital implants: Secondary | ICD-10-CM | POA: Diagnosis not present

## 2019-02-08 DIAGNOSIS — Z4822 Encounter for aftercare following kidney transplant: Secondary | ICD-10-CM | POA: Diagnosis not present

## 2019-02-08 DIAGNOSIS — D899 Disorder involving the immune mechanism, unspecified: Secondary | ICD-10-CM | POA: Diagnosis not present

## 2019-02-08 DIAGNOSIS — I10 Essential (primary) hypertension: Secondary | ICD-10-CM | POA: Diagnosis not present

## 2019-02-08 DIAGNOSIS — Z940 Kidney transplant status: Secondary | ICD-10-CM | POA: Diagnosis not present

## 2019-02-12 DIAGNOSIS — Z5181 Encounter for therapeutic drug level monitoring: Secondary | ICD-10-CM | POA: Diagnosis not present

## 2019-02-12 DIAGNOSIS — Z940 Kidney transplant status: Secondary | ICD-10-CM | POA: Diagnosis not present

## 2019-02-20 DIAGNOSIS — N19 Unspecified kidney failure: Secondary | ICD-10-CM | POA: Diagnosis not present

## 2019-02-20 DIAGNOSIS — Z299 Encounter for prophylactic measures, unspecified: Secondary | ICD-10-CM | POA: Diagnosis not present

## 2019-02-20 DIAGNOSIS — E7800 Pure hypercholesterolemia, unspecified: Secondary | ICD-10-CM | POA: Diagnosis not present

## 2019-02-20 DIAGNOSIS — Z713 Dietary counseling and surveillance: Secondary | ICD-10-CM | POA: Diagnosis not present

## 2019-02-20 DIAGNOSIS — Z6834 Body mass index (BMI) 34.0-34.9, adult: Secondary | ICD-10-CM | POA: Diagnosis not present

## 2019-02-23 DIAGNOSIS — Z940 Kidney transplant status: Secondary | ICD-10-CM | POA: Diagnosis not present

## 2019-02-23 DIAGNOSIS — Z4822 Encounter for aftercare following kidney transplant: Secondary | ICD-10-CM | POA: Diagnosis not present

## 2019-02-23 DIAGNOSIS — Z79899 Other long term (current) drug therapy: Secondary | ICD-10-CM | POA: Diagnosis not present

## 2019-02-23 DIAGNOSIS — R600 Localized edema: Secondary | ICD-10-CM | POA: Diagnosis not present

## 2019-02-23 DIAGNOSIS — Z960 Presence of urogenital implants: Secondary | ICD-10-CM | POA: Diagnosis not present

## 2019-03-06 DIAGNOSIS — Z5181 Encounter for therapeutic drug level monitoring: Secondary | ICD-10-CM | POA: Diagnosis not present

## 2019-03-06 DIAGNOSIS — Z940 Kidney transplant status: Secondary | ICD-10-CM | POA: Diagnosis not present

## 2019-03-16 DIAGNOSIS — I10 Essential (primary) hypertension: Secondary | ICD-10-CM | POA: Diagnosis not present

## 2019-03-16 DIAGNOSIS — Z5181 Encounter for therapeutic drug level monitoring: Secondary | ICD-10-CM | POA: Diagnosis not present

## 2019-03-16 DIAGNOSIS — D72819 Decreased white blood cell count, unspecified: Secondary | ICD-10-CM | POA: Diagnosis not present

## 2019-03-16 DIAGNOSIS — Z1159 Encounter for screening for other viral diseases: Secondary | ICD-10-CM | POA: Diagnosis not present

## 2019-03-16 DIAGNOSIS — R739 Hyperglycemia, unspecified: Secondary | ICD-10-CM | POA: Diagnosis not present

## 2019-03-16 DIAGNOSIS — Z960 Presence of urogenital implants: Secondary | ICD-10-CM | POA: Diagnosis not present

## 2019-03-16 DIAGNOSIS — T380X5A Adverse effect of glucocorticoids and synthetic analogues, initial encounter: Secondary | ICD-10-CM | POA: Diagnosis not present

## 2019-03-16 DIAGNOSIS — Z792 Long term (current) use of antibiotics: Secondary | ICD-10-CM | POA: Diagnosis not present

## 2019-03-16 DIAGNOSIS — Z4822 Encounter for aftercare following kidney transplant: Secondary | ICD-10-CM | POA: Diagnosis not present

## 2019-03-16 DIAGNOSIS — Z7952 Long term (current) use of systemic steroids: Secondary | ICD-10-CM | POA: Diagnosis not present

## 2019-03-16 DIAGNOSIS — E559 Vitamin D deficiency, unspecified: Secondary | ICD-10-CM | POA: Diagnosis not present

## 2019-03-16 DIAGNOSIS — D899 Disorder involving the immune mechanism, unspecified: Secondary | ICD-10-CM | POA: Diagnosis not present

## 2019-03-16 DIAGNOSIS — R600 Localized edema: Secondary | ICD-10-CM | POA: Diagnosis not present

## 2019-03-16 DIAGNOSIS — Z940 Kidney transplant status: Secondary | ICD-10-CM | POA: Diagnosis not present

## 2019-03-21 DIAGNOSIS — D899 Disorder involving the immune mechanism, unspecified: Secondary | ICD-10-CM | POA: Diagnosis not present

## 2019-03-21 DIAGNOSIS — Z7982 Long term (current) use of aspirin: Secondary | ICD-10-CM | POA: Diagnosis not present

## 2019-03-21 DIAGNOSIS — T375X5A Adverse effect of antiviral drugs, initial encounter: Secondary | ICD-10-CM | POA: Diagnosis present

## 2019-03-21 DIAGNOSIS — T888XXA Other specified complications of surgical and medical care, not elsewhere classified, initial encounter: Secondary | ICD-10-CM | POA: Diagnosis not present

## 2019-03-21 DIAGNOSIS — N1330 Unspecified hydronephrosis: Secondary | ICD-10-CM | POA: Diagnosis not present

## 2019-03-21 DIAGNOSIS — R7989 Other specified abnormal findings of blood chemistry: Secondary | ICD-10-CM | POA: Diagnosis present

## 2019-03-21 DIAGNOSIS — D708 Other neutropenia: Secondary | ICD-10-CM | POA: Diagnosis not present

## 2019-03-21 DIAGNOSIS — Z4682 Encounter for fitting and adjustment of non-vascular catheter: Secondary | ICD-10-CM | POA: Diagnosis not present

## 2019-03-21 DIAGNOSIS — R188 Other ascites: Secondary | ICD-10-CM | POA: Diagnosis not present

## 2019-03-21 DIAGNOSIS — Z20828 Contact with and (suspected) exposure to other viral communicable diseases: Secondary | ICD-10-CM | POA: Diagnosis not present

## 2019-03-21 DIAGNOSIS — Z940 Kidney transplant status: Secondary | ICD-10-CM | POA: Diagnosis not present

## 2019-03-21 DIAGNOSIS — T8610 Unspecified complication of kidney transplant: Secondary | ICD-10-CM | POA: Diagnosis not present

## 2019-03-21 DIAGNOSIS — Q613 Polycystic kidney, unspecified: Secondary | ICD-10-CM | POA: Diagnosis not present

## 2019-03-21 DIAGNOSIS — Z888 Allergy status to other drugs, medicaments and biological substances status: Secondary | ICD-10-CM | POA: Diagnosis not present

## 2019-03-21 DIAGNOSIS — Z5181 Encounter for therapeutic drug level monitoring: Secondary | ICD-10-CM | POA: Diagnosis not present

## 2019-03-21 DIAGNOSIS — E559 Vitamin D deficiency, unspecified: Secondary | ICD-10-CM | POA: Diagnosis present

## 2019-03-21 DIAGNOSIS — Z1159 Encounter for screening for other viral diseases: Secondary | ICD-10-CM | POA: Diagnosis not present

## 2019-03-21 DIAGNOSIS — N2889 Other specified disorders of kidney and ureter: Secondary | ICD-10-CM | POA: Diagnosis not present

## 2019-03-21 DIAGNOSIS — I10 Essential (primary) hypertension: Secondary | ICD-10-CM | POA: Diagnosis not present

## 2019-03-21 DIAGNOSIS — Q612 Polycystic kidney, adult type: Secondary | ICD-10-CM | POA: Diagnosis not present

## 2019-03-21 DIAGNOSIS — N179 Acute kidney failure, unspecified: Secondary | ICD-10-CM | POA: Diagnosis present

## 2019-03-21 DIAGNOSIS — D702 Other drug-induced agranulocytosis: Secondary | ICD-10-CM | POA: Diagnosis present

## 2019-03-21 DIAGNOSIS — T8619 Other complication of kidney transplant: Secondary | ICD-10-CM | POA: Diagnosis not present

## 2019-03-21 DIAGNOSIS — F419 Anxiety disorder, unspecified: Secondary | ICD-10-CM | POA: Diagnosis present

## 2019-03-21 DIAGNOSIS — Z79899 Other long term (current) drug therapy: Secondary | ICD-10-CM | POA: Diagnosis not present

## 2019-03-28 DIAGNOSIS — Z5181 Encounter for therapeutic drug level monitoring: Secondary | ICD-10-CM | POA: Diagnosis not present

## 2019-03-28 DIAGNOSIS — B349 Viral infection, unspecified: Secondary | ICD-10-CM | POA: Diagnosis not present

## 2019-03-28 DIAGNOSIS — Z940 Kidney transplant status: Secondary | ICD-10-CM | POA: Diagnosis not present

## 2019-03-28 DIAGNOSIS — B259 Cytomegaloviral disease, unspecified: Secondary | ICD-10-CM | POA: Diagnosis not present

## 2019-04-05 DIAGNOSIS — Z940 Kidney transplant status: Secondary | ICD-10-CM | POA: Diagnosis not present

## 2019-04-10 DIAGNOSIS — B349 Viral infection, unspecified: Secondary | ICD-10-CM | POA: Diagnosis not present

## 2019-04-10 DIAGNOSIS — B259 Cytomegaloviral disease, unspecified: Secondary | ICD-10-CM | POA: Diagnosis not present

## 2019-04-10 DIAGNOSIS — Z5181 Encounter for therapeutic drug level monitoring: Secondary | ICD-10-CM | POA: Diagnosis not present

## 2019-04-10 DIAGNOSIS — Z940 Kidney transplant status: Secondary | ICD-10-CM | POA: Diagnosis not present

## 2019-04-18 DIAGNOSIS — Z5181 Encounter for therapeutic drug level monitoring: Secondary | ICD-10-CM | POA: Diagnosis not present

## 2019-04-18 DIAGNOSIS — B349 Viral infection, unspecified: Secondary | ICD-10-CM | POA: Diagnosis not present

## 2019-04-18 DIAGNOSIS — Z940 Kidney transplant status: Secondary | ICD-10-CM | POA: Diagnosis not present

## 2019-04-18 DIAGNOSIS — B259 Cytomegaloviral disease, unspecified: Secondary | ICD-10-CM | POA: Diagnosis not present

## 2019-05-11 DIAGNOSIS — B349 Viral infection, unspecified: Secondary | ICD-10-CM | POA: Diagnosis not present

## 2019-05-11 DIAGNOSIS — B259 Cytomegaloviral disease, unspecified: Secondary | ICD-10-CM | POA: Diagnosis not present

## 2019-05-11 DIAGNOSIS — Z5181 Encounter for therapeutic drug level monitoring: Secondary | ICD-10-CM | POA: Diagnosis not present

## 2019-05-11 DIAGNOSIS — Z940 Kidney transplant status: Secondary | ICD-10-CM | POA: Diagnosis not present

## 2019-05-21 DIAGNOSIS — Z940 Kidney transplant status: Secondary | ICD-10-CM | POA: Diagnosis not present

## 2019-05-21 DIAGNOSIS — Z5181 Encounter for therapeutic drug level monitoring: Secondary | ICD-10-CM | POA: Diagnosis not present

## 2019-05-21 DIAGNOSIS — B259 Cytomegaloviral disease, unspecified: Secondary | ICD-10-CM | POA: Diagnosis not present

## 2019-05-21 DIAGNOSIS — B349 Viral infection, unspecified: Secondary | ICD-10-CM | POA: Diagnosis not present

## 2019-05-28 DIAGNOSIS — B259 Cytomegaloviral disease, unspecified: Secondary | ICD-10-CM | POA: Diagnosis not present

## 2019-05-28 DIAGNOSIS — Z940 Kidney transplant status: Secondary | ICD-10-CM | POA: Diagnosis not present

## 2019-05-28 DIAGNOSIS — Z5181 Encounter for therapeutic drug level monitoring: Secondary | ICD-10-CM | POA: Diagnosis not present

## 2019-05-28 DIAGNOSIS — B349 Viral infection, unspecified: Secondary | ICD-10-CM | POA: Diagnosis not present

## 2019-06-29 DIAGNOSIS — Z940 Kidney transplant status: Secondary | ICD-10-CM | POA: Diagnosis not present

## 2019-06-29 DIAGNOSIS — B349 Viral infection, unspecified: Secondary | ICD-10-CM | POA: Diagnosis not present

## 2019-06-29 DIAGNOSIS — B259 Cytomegaloviral disease, unspecified: Secondary | ICD-10-CM | POA: Diagnosis not present

## 2019-06-29 DIAGNOSIS — Z5181 Encounter for therapeutic drug level monitoring: Secondary | ICD-10-CM | POA: Diagnosis not present

## 2019-07-31 DIAGNOSIS — Z5181 Encounter for therapeutic drug level monitoring: Secondary | ICD-10-CM | POA: Diagnosis not present

## 2019-07-31 DIAGNOSIS — B349 Viral infection, unspecified: Secondary | ICD-10-CM | POA: Diagnosis not present

## 2019-07-31 DIAGNOSIS — B259 Cytomegaloviral disease, unspecified: Secondary | ICD-10-CM | POA: Diagnosis not present

## 2019-07-31 DIAGNOSIS — Z940 Kidney transplant status: Secondary | ICD-10-CM | POA: Diagnosis not present

## 2019-08-14 DIAGNOSIS — Z5181 Encounter for therapeutic drug level monitoring: Secondary | ICD-10-CM | POA: Diagnosis not present

## 2019-08-14 DIAGNOSIS — B259 Cytomegaloviral disease, unspecified: Secondary | ICD-10-CM | POA: Diagnosis not present

## 2019-08-14 DIAGNOSIS — B349 Viral infection, unspecified: Secondary | ICD-10-CM | POA: Diagnosis not present

## 2019-08-14 DIAGNOSIS — Z940 Kidney transplant status: Secondary | ICD-10-CM | POA: Diagnosis not present

## 2019-09-27 DIAGNOSIS — Z5181 Encounter for therapeutic drug level monitoring: Secondary | ICD-10-CM | POA: Diagnosis not present

## 2019-09-27 DIAGNOSIS — B259 Cytomegaloviral disease, unspecified: Secondary | ICD-10-CM | POA: Diagnosis not present

## 2019-09-27 DIAGNOSIS — Z940 Kidney transplant status: Secondary | ICD-10-CM | POA: Diagnosis not present

## 2019-09-27 DIAGNOSIS — B349 Viral infection, unspecified: Secondary | ICD-10-CM | POA: Diagnosis not present

## 2019-10-23 DIAGNOSIS — Z940 Kidney transplant status: Secondary | ICD-10-CM | POA: Diagnosis not present

## 2019-10-23 DIAGNOSIS — Z9483 Pancreas transplant status: Secondary | ICD-10-CM | POA: Diagnosis not present

## 2019-10-23 DIAGNOSIS — B349 Viral infection, unspecified: Secondary | ICD-10-CM | POA: Diagnosis not present

## 2019-10-23 DIAGNOSIS — Z5181 Encounter for therapeutic drug level monitoring: Secondary | ICD-10-CM | POA: Diagnosis not present

## 2019-10-23 DIAGNOSIS — B259 Cytomegaloviral disease, unspecified: Secondary | ICD-10-CM | POA: Diagnosis not present
